# Patient Record
Sex: Female | Born: 1965 | Race: White | Hispanic: No | Marital: Married | Smoking: Never smoker
Health system: Southern US, Community
[De-identification: ages and names within clinical notes are randomized; demographics above are authoritative.]

## PROBLEM LIST (undated history)

## (undated) DIAGNOSIS — M549 Dorsalgia, unspecified: Secondary | ICD-10-CM

## (undated) DIAGNOSIS — Z86718 Personal history of other venous thrombosis and embolism: Secondary | ICD-10-CM

## (undated) DIAGNOSIS — F329 Major depressive disorder, single episode, unspecified: Secondary | ICD-10-CM

## (undated) DIAGNOSIS — E785 Hyperlipidemia, unspecified: Secondary | ICD-10-CM

## (undated) DIAGNOSIS — I10 Essential (primary) hypertension: Secondary | ICD-10-CM

## (undated) DIAGNOSIS — G8929 Other chronic pain: Secondary | ICD-10-CM

## (undated) DIAGNOSIS — R51 Headache: Secondary | ICD-10-CM

## (undated) DIAGNOSIS — F32A Depression, unspecified: Secondary | ICD-10-CM

## (undated) DIAGNOSIS — R519 Headache, unspecified: Secondary | ICD-10-CM

## (undated) DIAGNOSIS — K529 Noninfective gastroenteritis and colitis, unspecified: Secondary | ICD-10-CM

## (undated) HISTORY — DX: Headache, unspecified: R51.9

## (undated) HISTORY — DX: Other chronic pain: G89.29

## (undated) HISTORY — DX: Noninfective gastroenteritis and colitis, unspecified: K52.9

## (undated) HISTORY — DX: Hyperlipidemia, unspecified: E78.5

## (undated) HISTORY — DX: Dorsalgia, unspecified: M54.9

## (undated) HISTORY — PX: PELVIC LAPAROSCOPY: SHX162

## (undated) HISTORY — DX: Headache: R51

## (undated) HISTORY — DX: Depression, unspecified: F32.A

## (undated) HISTORY — DX: Major depressive disorder, single episode, unspecified: F32.9

## (undated) HISTORY — DX: Personal history of other venous thrombosis and embolism: Z86.718

## (undated) HISTORY — DX: Essential (primary) hypertension: I10

---

## 1988-02-23 HISTORY — PX: ABDOMINAL HYSTERECTOMY: SHX81

## 1998-02-22 DIAGNOSIS — Z86718 Personal history of other venous thrombosis and embolism: Secondary | ICD-10-CM

## 1998-02-22 HISTORY — DX: Personal history of other venous thrombosis and embolism: Z86.718

## 1998-05-30 ENCOUNTER — Encounter: Payer: Self-pay | Admitting: Chiropractic Medicine

## 1998-05-30 ENCOUNTER — Ambulatory Visit (HOSPITAL_COMMUNITY): Admission: RE | Admit: 1998-05-30 | Discharge: 1998-05-30 | Payer: Self-pay | Admitting: Chiropractic Medicine

## 1998-07-29 ENCOUNTER — Encounter: Payer: Self-pay | Admitting: Neurosurgery

## 1998-07-29 ENCOUNTER — Ambulatory Visit (HOSPITAL_COMMUNITY): Admission: RE | Admit: 1998-07-29 | Discharge: 1998-07-29 | Payer: Self-pay | Admitting: Neurosurgery

## 1999-12-05 ENCOUNTER — Emergency Department (HOSPITAL_COMMUNITY): Admission: EM | Admit: 1999-12-05 | Discharge: 1999-12-05 | Payer: Self-pay | Admitting: Emergency Medicine

## 2000-10-28 ENCOUNTER — Other Ambulatory Visit: Admission: RE | Admit: 2000-10-28 | Discharge: 2000-10-28 | Payer: Self-pay | Admitting: Obstetrics & Gynecology

## 2001-06-15 ENCOUNTER — Inpatient Hospital Stay (HOSPITAL_COMMUNITY): Admission: EM | Admit: 2001-06-15 | Discharge: 2001-06-16 | Payer: Self-pay | Admitting: Emergency Medicine

## 2001-06-28 ENCOUNTER — Encounter: Admission: RE | Admit: 2001-06-28 | Discharge: 2001-09-26 | Payer: Self-pay | Admitting: *Deleted

## 2002-11-13 LAB — HM COLONOSCOPY

## 2003-01-07 ENCOUNTER — Ambulatory Visit (HOSPITAL_COMMUNITY): Admission: RE | Admit: 2003-01-07 | Discharge: 2003-01-07 | Payer: Self-pay | Admitting: Gastroenterology

## 2003-01-12 ENCOUNTER — Inpatient Hospital Stay (HOSPITAL_COMMUNITY): Admission: EM | Admit: 2003-01-12 | Discharge: 2003-01-13 | Payer: Self-pay | Admitting: Emergency Medicine

## 2003-03-27 ENCOUNTER — Other Ambulatory Visit: Admission: RE | Admit: 2003-03-27 | Discharge: 2003-03-27 | Payer: Self-pay | Admitting: Obstetrics & Gynecology

## 2003-04-29 ENCOUNTER — Encounter: Admission: RE | Admit: 2003-04-29 | Discharge: 2003-04-29 | Payer: Self-pay | Admitting: Obstetrics & Gynecology

## 2003-08-23 ENCOUNTER — Emergency Department (HOSPITAL_COMMUNITY): Admission: EM | Admit: 2003-08-23 | Discharge: 2003-08-23 | Payer: Self-pay | Admitting: Emergency Medicine

## 2004-07-20 ENCOUNTER — Emergency Department (HOSPITAL_COMMUNITY): Admission: EM | Admit: 2004-07-20 | Discharge: 2004-07-20 | Payer: Self-pay | Admitting: *Deleted

## 2005-06-14 ENCOUNTER — Inpatient Hospital Stay (HOSPITAL_COMMUNITY): Admission: EM | Admit: 2005-06-14 | Discharge: 2005-06-16 | Payer: Self-pay | Admitting: Family Medicine

## 2005-08-04 ENCOUNTER — Encounter: Admission: RE | Admit: 2005-08-04 | Discharge: 2005-08-04 | Payer: Self-pay | Admitting: Obstetrics & Gynecology

## 2005-08-04 LAB — HM MAMMOGRAPHY: HM Mammogram: NORMAL

## 2005-08-19 ENCOUNTER — Encounter: Admission: RE | Admit: 2005-08-19 | Discharge: 2005-08-19 | Payer: Self-pay | Admitting: Obstetrics & Gynecology

## 2006-02-22 HISTORY — PX: OOPHORECTOMY: SHX86

## 2006-08-02 ENCOUNTER — Ambulatory Visit (HOSPITAL_COMMUNITY): Admission: RE | Admit: 2006-08-02 | Discharge: 2006-08-03 | Payer: Self-pay | Admitting: Obstetrics & Gynecology

## 2006-08-02 ENCOUNTER — Encounter (INDEPENDENT_AMBULATORY_CARE_PROVIDER_SITE_OTHER): Payer: Self-pay | Admitting: Obstetrics & Gynecology

## 2006-09-02 LAB — CONVERTED CEMR LAB: Pap Smear: NORMAL

## 2006-09-15 ENCOUNTER — Emergency Department (HOSPITAL_COMMUNITY): Admission: EM | Admit: 2006-09-15 | Discharge: 2006-09-15 | Payer: Self-pay | Admitting: Emergency Medicine

## 2008-03-25 HISTORY — PX: SPINE SURGERY: SHX786

## 2008-03-26 ENCOUNTER — Inpatient Hospital Stay (HOSPITAL_COMMUNITY): Admission: RE | Admit: 2008-03-26 | Discharge: 2008-03-28 | Payer: Self-pay | Admitting: Orthopedic Surgery

## 2008-03-26 ENCOUNTER — Encounter (INDEPENDENT_AMBULATORY_CARE_PROVIDER_SITE_OTHER): Payer: Self-pay | Admitting: Orthopedic Surgery

## 2008-07-30 LAB — HM PAP SMEAR: HM Pap smear: NORMAL

## 2008-10-09 ENCOUNTER — Emergency Department (HOSPITAL_BASED_OUTPATIENT_CLINIC_OR_DEPARTMENT_OTHER): Admission: EM | Admit: 2008-10-09 | Discharge: 2008-10-09 | Payer: Self-pay | Admitting: Emergency Medicine

## 2008-10-09 ENCOUNTER — Ambulatory Visit: Payer: Self-pay | Admitting: Diagnostic Radiology

## 2008-10-10 ENCOUNTER — Ambulatory Visit (HOSPITAL_COMMUNITY): Admission: RE | Admit: 2008-10-10 | Discharge: 2008-10-10 | Payer: Self-pay | Admitting: Emergency Medicine

## 2008-10-29 ENCOUNTER — Ambulatory Visit: Payer: Self-pay | Admitting: Internal Medicine

## 2008-10-29 DIAGNOSIS — R51 Headache: Secondary | ICD-10-CM | POA: Insufficient documentation

## 2008-10-29 DIAGNOSIS — F329 Major depressive disorder, single episode, unspecified: Secondary | ICD-10-CM | POA: Insufficient documentation

## 2008-10-29 DIAGNOSIS — I10 Essential (primary) hypertension: Secondary | ICD-10-CM | POA: Insufficient documentation

## 2008-10-29 DIAGNOSIS — E1169 Type 2 diabetes mellitus with other specified complication: Secondary | ICD-10-CM | POA: Insufficient documentation

## 2008-10-29 DIAGNOSIS — R519 Headache, unspecified: Secondary | ICD-10-CM | POA: Insufficient documentation

## 2008-10-29 DIAGNOSIS — E785 Hyperlipidemia, unspecified: Secondary | ICD-10-CM

## 2008-11-19 ENCOUNTER — Ambulatory Visit: Payer: Self-pay | Admitting: Internal Medicine

## 2008-11-19 LAB — HM DIABETES FOOT EXAM

## 2008-12-09 LAB — HM DIABETES EYE EXAM: HM Diabetic Eye Exam: NORMAL

## 2009-02-13 ENCOUNTER — Ambulatory Visit: Payer: Self-pay | Admitting: Internal Medicine

## 2009-02-13 LAB — CONVERTED CEMR LAB
ALT: 29 units/L (ref 0–35)
AST: 19 units/L (ref 0–37)
Albumin: 4.6 g/dL (ref 3.5–5.2)
Alkaline Phosphatase: 84 units/L (ref 39–117)
BUN: 18 mg/dL (ref 6–23)
Bilirubin Urine: NEGATIVE
Bilirubin, Direct: 0.1 mg/dL (ref 0.0–0.3)
CO2: 26 meq/L (ref 19–32)
Calcium: 9.7 mg/dL (ref 8.4–10.5)
Casts: NONE SEEN /lpf
Chloride: 100 meq/L (ref 96–112)
Cholesterol: 247 mg/dL — ABNORMAL HIGH (ref 0–200)
Creatinine, Ser: 0.74 mg/dL (ref 0.40–1.20)
Crystals: NONE SEEN
Glucose, Bld: 187 mg/dL — ABNORMAL HIGH (ref 70–99)
HDL: 40 mg/dL (ref >39)
Hemoglobin, Urine: NEGATIVE
Hgb A1c MFr Bld: 10.4 % — ABNORMAL HIGH (ref 4.6–6.1)
Indirect Bilirubin: 0.4 mg/dL (ref 0.0–0.9)
LDL Cholesterol: 172 mg/dL — ABNORMAL HIGH (ref 0–99)
Leukocytes, UA: NEGATIVE
Nitrite: NEGATIVE
Potassium: 4.2 meq/L (ref 3.5–5.3)
Protein, ur: NEGATIVE mg/dL
RBC / HPF: NONE SEEN (ref <3)
Sodium: 136 meq/L (ref 135–145)
Specific Gravity, Urine: 1.028 (ref 1.005–1.030)
TSH: 4.246 microintl units/mL (ref 0.350–4.500)
Total Bilirubin: 0.5 mg/dL (ref 0.3–1.2)
Total CHOL/HDL Ratio: 6.2
Total Protein: 7.4 g/dL (ref 6.0–8.3)
Triglycerides: 175 mg/dL — ABNORMAL HIGH (ref <150)
Urine Glucose: NEGATIVE mg/dL
Urobilinogen, UA: 0.2 (ref 0.0–1.0)
VLDL: 35 mg/dL (ref 0–40)
WBC, UA: NONE SEEN cells/hpf (ref <3)
pH: 5.5 (ref 5.0–8.0)

## 2009-02-20 ENCOUNTER — Ambulatory Visit: Payer: Self-pay | Admitting: Internal Medicine

## 2009-02-20 DIAGNOSIS — E669 Obesity, unspecified: Secondary | ICD-10-CM | POA: Insufficient documentation

## 2009-02-20 DIAGNOSIS — R002 Palpitations: Secondary | ICD-10-CM | POA: Insufficient documentation

## 2009-02-20 DIAGNOSIS — R079 Chest pain, unspecified: Secondary | ICD-10-CM | POA: Insufficient documentation

## 2009-02-20 DIAGNOSIS — E1169 Type 2 diabetes mellitus with other specified complication: Secondary | ICD-10-CM | POA: Insufficient documentation

## 2009-02-20 LAB — CONVERTED CEMR LAB: Microalb, Ur: 1.23 mg/dL (ref 0.00–1.89)

## 2009-02-24 ENCOUNTER — Encounter (INDEPENDENT_AMBULATORY_CARE_PROVIDER_SITE_OTHER): Payer: Self-pay | Admitting: *Deleted

## 2009-02-26 ENCOUNTER — Telehealth: Payer: Self-pay | Admitting: Internal Medicine

## 2009-03-07 DIAGNOSIS — R7309 Other abnormal glucose: Secondary | ICD-10-CM | POA: Insufficient documentation

## 2009-03-12 ENCOUNTER — Encounter: Payer: Self-pay | Admitting: Cardiology

## 2009-03-12 ENCOUNTER — Ambulatory Visit: Payer: Self-pay | Admitting: Cardiology

## 2009-03-20 ENCOUNTER — Ambulatory Visit: Payer: Self-pay | Admitting: Internal Medicine

## 2009-03-31 ENCOUNTER — Ambulatory Visit: Payer: Self-pay

## 2009-03-31 ENCOUNTER — Ambulatory Visit: Payer: Self-pay | Admitting: Cardiology

## 2009-03-31 ENCOUNTER — Encounter: Payer: Self-pay | Admitting: Cardiology

## 2009-03-31 ENCOUNTER — Ambulatory Visit (HOSPITAL_COMMUNITY): Admission: RE | Admit: 2009-03-31 | Discharge: 2009-03-31 | Payer: Self-pay | Admitting: Cardiology

## 2009-05-05 ENCOUNTER — Telehealth: Payer: Self-pay | Admitting: Internal Medicine

## 2009-05-26 ENCOUNTER — Encounter: Payer: Self-pay | Admitting: Internal Medicine

## 2009-05-26 LAB — CONVERTED CEMR LAB
ALT: 34 units/L (ref 0–35)
AST: 21 units/L (ref 0–37)
BUN: 17 mg/dL (ref 6–23)
CO2: 28 meq/L (ref 19–32)
Calcium: 9.6 mg/dL (ref 8.4–10.5)
Chloride: 101 meq/L (ref 96–112)
Cholesterol: 217 mg/dL — ABNORMAL HIGH (ref 0–200)
Creatinine, Ser: 0.71 mg/dL (ref 0.40–1.20)
Glucose, Bld: 189 mg/dL — ABNORMAL HIGH (ref 70–99)
HDL: 36 mg/dL — ABNORMAL LOW (ref >39)
Hgb A1c MFr Bld: 11.2 % — ABNORMAL HIGH (ref 4.6–6.1)
LDL Cholesterol: 150 mg/dL — ABNORMAL HIGH (ref 0–99)
Potassium: 4.3 meq/L (ref 3.5–5.3)
Sodium: 138 meq/L (ref 135–145)
Total CHOL/HDL Ratio: 6
Triglycerides: 157 mg/dL — ABNORMAL HIGH (ref <150)
VLDL: 31 mg/dL (ref 0–40)

## 2009-06-02 ENCOUNTER — Ambulatory Visit: Payer: Self-pay | Admitting: Internal Medicine

## 2009-06-02 DIAGNOSIS — M545 Low back pain, unspecified: Secondary | ICD-10-CM | POA: Insufficient documentation

## 2009-06-04 ENCOUNTER — Encounter (INDEPENDENT_AMBULATORY_CARE_PROVIDER_SITE_OTHER): Payer: Self-pay | Admitting: *Deleted

## 2009-06-19 ENCOUNTER — Encounter: Payer: Self-pay | Admitting: Internal Medicine

## 2009-07-02 ENCOUNTER — Telehealth: Payer: Self-pay | Admitting: Internal Medicine

## 2010-02-23 ENCOUNTER — Emergency Department (HOSPITAL_BASED_OUTPATIENT_CLINIC_OR_DEPARTMENT_OTHER)
Admission: EM | Admit: 2010-02-23 | Discharge: 2010-02-24 | Disposition: A | Discharge status: Other Acute Inpatient Hospital | Payer: Self-pay | Source: Home / Self Care | Admitting: Emergency Medicine

## 2010-02-24 ENCOUNTER — Inpatient Hospital Stay (HOSPITAL_COMMUNITY)
Admission: EM | Admit: 2010-02-24 | Discharge: 2010-02-24 | Payer: Self-pay | Source: Home / Self Care | Attending: Otolaryngology | Admitting: Otolaryngology

## 2010-03-24 NOTE — Assessment & Plan Note (Signed)
Summary: [facility]   Visit Type:  Initial Consult Primary Provider:  Dondra Spry DO  CC:  chest pain.  History of Present Illness: 45 yo female for evaluation of chest pain. Pt had a Myoview in April of 2007 that showed normal LV function and normal perfusion. She has had intermittent chest pain for years. The pain is in the left chest area. She states it increases with stress. He can also occur with exertion but it also occurs in the mornings with rest. He described as a pressure. There is a tingling in the left upper extremity. There is no associated nausea, vomiting, shortness of breath or diaphoresis. The pain is not pleuritic or positional nor is it related to food. Because of her chest pain we were asked to further evaluate. Note she did not have dyspnea on exertion, orthopnea, PND or syncope. Also note she does state that she occasionally feels severe pain in her head when she has her chest pain.  Preventive Screening-Counseling & Management  Alcohol-Tobacco     Smoking Status: never  Current Medications (verified): 1)  Lantus Solostar 100 Unit/ml Soln (Insulin Glargine) .... 60-80 Units Once Daily 2)  Novolog Flexpen 100 Unit/ml Soln (Insulin Aspart) .Marland KitchenMarland KitchenMarland Kitchen 15-30 Units 15-30 Mins Before Meals Tid 3)  Lisinopril 20 Mg Tabs (Lisinopril) .... One By Mouth Qd 4)  Multivitamins  Tabs (Multiple Vitamin) .... Take 1 Tablet By Mouth Once A Day 5)  Tramadol Hcl 50 Mg Tabs (Tramadol Hcl) .... Take 1 Tablet By Mouth Two Times A Day As Needed 6)  Vicodin 5-500 Mg Tabs (Hydrocodone-Acetaminophen) .... Take 1 Tablet By Mouth Once A Day As Needed Back Pain 7)  Metformin Hcl 500 Mg Tabs (Metformin Hcl) .... One By Mouth Bid 8)  Aspirin 81 Mg Tbec (Aspirin) .... One By Mouth Qd 9)  Bd Pen Needle Ultrafine 29g X 12.77mm Misc (Insulin Pen Needle) .... Use For Insulin Injection 4 Times A Day As Directed 10)  Crestor 20 Mg Tabs (Rosuvastatin Calcium) .... One By Mouth Once Daily 11)  Amlodipine  Besylate 2.5 Mg Tabs (Amlodipine Besylate) .... One By Mouth Once Daily  Allergies (verified): No Known Drug Allergies  Past History:  Past Medical History: Current Problems:  HYPERLIPIDEMIA (ICD-272.4) HYPERTENSION (ICD-401.9) DIABETES MELLITUS, TYPE II, UNCONTROLLED (ICD-250.02) DEPRESSION (ICD-311) inflammatory bowel syndrome  Headache- daily  History of Blood Clot 2000  Hx of chronic low back pain  Past Surgical History: Back Surgey Feb 2010 - Dr. Alveda Reasons  oophorectomy 2008 1990- Hysterecotomy   H/O laporoscopy for endometriosis  Family History: Reviewed history from 02/20/2009 and no changes required. Family History of Alcoholism/Addiction Family History of Arthritis Family History Breast cancer 1st degree relative <50 Family History of CAD Female 1st degree relative <60 Family History of Colon CA 1st degree relative <60 Family History Diabetes 1st degree relative Family History High cholesterol Family History Hypertension Family History Kidney disease Family History Ovarian cancer Family History of Prostate CA 1st degree relative <50 Family History Psychiatric care Family History of Stroke F 1st degree relative <60 Family History of Stroke M 1st degree relative <50   Mother with MI at 36     Social History: Reviewed history from 02/20/2009 and no changes required. Occupation: Paediatric nurse Married 9 years  2 daughters 55, 55  Tobacco Use - No.  Alcohol Use - yes  Review of Systems       Problems with headaches, and constipation but no fevers or chills, productive cough, hemoptysis, dysphasia,  odynophagia, melena, hematochezia, dysuria, hematuria, rash, seizure activity, orthopnea, PND, pedal edema, claudication. Remaining systems are negative.   Vital Signs:  Patient profile:   45 year old female Height:      67 inches Weight:      216.50 pounds BMI:     34.03 Pulse rate:   82 / minute Pulse rhythm:   regular Resp:     18 per minute BP sitting:    135 / 92  (right arm) Cuff size:   large  Vitals Entered By: Vikki Ports (March 12, 2009 2:55 PM)  Physical Exam  General:  Well developed/well nourished in NAD Skin warm/dry Patient not depressed No peripheral clubbing Back-normal HEENT-normal/normal eyelids Neck supple/normal carotid upstroke bilaterally; no bruits; no JVD; no thyromegaly chest - CTA/ normal expansion CV - RRR/normal S1 and S2; no murmurs, rubs or gallops;  PMI nondisplaced Abdomen -NT/ND, no HSM, no mass, + bowel sounds, no bruit 2+ femoral pulses, no bruits Ext-no edema, chords, 2+ DP Neuro-grossly nonfocal     EKG  Procedure date:  03/12/2009  Findings:      Sinus rhythm at a rate of 90. Axis normal. No ST changes.  Impression & Recommendations:  Problem # 1:  CHEST PAIN (ICD-786.50) Symptoms atypical multiple risk factors. Will risk stratify her stress echocardiogram. Her updated medication list for this problem includes:    Lisinopril 20 Mg Tabs (Lisinopril) ..... One by mouth qd    Aspirin 81 Mg Tbec (Aspirin) ..... One by mouth qd    Amlodipine Besylate 2.5 Mg Tabs (Amlodipine besylate) ..... One by mouth once daily  Orders: Stress Echo (Stress Echo)  Problem # 2:  HYPERLIPIDEMIA (ICD-272.4) Continue statin. Lipids and liver monitored by primary care. Her updated medication list for this problem includes:    Crestor 20 Mg Tabs (Rosuvastatin calcium) ..... One by mouth once daily  Problem # 3:  HYPERTENSION (ICD-401.9) Blood pressure mildly elevated. She will follow this and her medications can be increased as needed by her primary care physician. Her updated medication list for this problem includes:    Lisinopril 20 Mg Tabs (Lisinopril) ..... One by mouth qd    Aspirin 81 Mg Tbec (Aspirin) ..... One by mouth qd    Amlodipine Besylate 2.5 Mg Tabs (Amlodipine besylate) ..... One by mouth once daily  Problem # 4:  DIABETES MELLITUS, TYPE II, UNCONTROLLED (ICD-250.02)  Her updated  medication list for this problem includes:    Lantus Solostar 100 Unit/ml Soln (Insulin glargine) .Marland KitchenMarland KitchenMarland KitchenMarland Kitchen 60-80 units once daily    Novolog Flexpen 100 Unit/ml Soln (Insulin aspart) .Marland KitchenMarland KitchenMarland KitchenMarland Kitchen 15-30 units 15-30 mins before meals tid    Lisinopril 20 Mg Tabs (Lisinopril) ..... One by mouth qd    Metformin Hcl 500 Mg Tabs (Metformin hcl) ..... One by mouth bid    Aspirin 81 Mg Tbec (Aspirin) ..... One by mouth qd  Problem # 5:  DEPRESSION (ICD-311)  Problem # 6:  HEADACHE (ICD-784.0)  Her updated medication list for this problem includes:    Tramadol Hcl 50 Mg Tabs (Tramadol hcl) .Marland Kitchen... Take 1 tablet by mouth two times a day as needed    Vicodin 5-500 Mg Tabs (Hydrocodone-acetaminophen) .Marland Kitchen... Take 1 tablet by mouth once a day as needed back pain    Aspirin 81 Mg Tbec (Aspirin) ..... One by mouth qd  Patient Instructions: 1)  Your physician recommends that you schedule a follow-up appointment in: AS NEEDED PENDING TEST RESULTS 2)  Your physician has requested that  you have a stress echocardiogram. For further information please visit [email or web address]  Please follow instruction sheet as given.

## 2010-03-24 NOTE — Assessment & Plan Note (Signed)
Summary: 3 MONTH FOLLOW UP/DK   Vital Signs:  Patient profile:   45 year old female Weight:      219.25 pounds Temp:     98.1 degrees F oral Pulse rate:   72 / minute BP sitting:   140 / 80  (left arm) Cuff size:   regular  Vitals Entered By: Romualdo Bolk, CMA (AAMA) (February 20, 2009 2:27 PM) CC: 3 month follow up, Type 2 diabetes mellitus follow-up   Primary Care Provider:  DThomos Lemons DO  CC:  3 month follow up and Type 2 diabetes mellitus follow-up.  History of Present Illness:  Type 2 Diabetes Mellitus Follow-Up      This is a 45 year old woman who presents for Type 2 diabetes mellitus follow-up.  The patient denies polyuria, polydipsia, self managed hypoglycemia, and hypoglycemia requiring help.  Other symptoms include chest pain.  Since the last visit the patient reports compliance with medications, not exercising regularly, and monitoring blood glucose.  her morning blood sugar was 252 this morning.  Before she started metformin and her current insulin regimen her blood sugar was between 500 and 600.       The patient also complains of experiencing Chest pain.  The patient reports resting chest pain and palpitations, but denies shortness of breath.  The pain is located in the substernal area.  The pain radiates to the left arm.  Episodes of chest pain last 1-2 minutes.  chest pain comes and goes.   She is not currently experiencing any chest pain.  Preventive Screening-Counseling & Management  Alcohol-Tobacco     Alcohol drinks/day: 0     Alcohol Counseling: not indicated; patient does not drink     Smoking Status: never     Tobacco Counseling: not indicated; no tobacco use  Caffeine-Diet-Exercise     Caffeine use/day: 2 liters daily up to 2 weeks ago     Caffeine Counseling: STOP use of caffeine     Does Patient Exercise: yes     Type of exercise: walking     Times/week: 5  Current Medications (verified): 1)  Lantus Solostar 100 Unit/ml Soln (Insulin  Glargine) .... 60-80 Units Once Daily 2)  Novolog Flexpen 100 Unit/ml Soln (Insulin Aspart) .Marland KitchenMarland KitchenMarland Kitchen 15-30 Units 15-30 Mins Before Meals Tid 3)  Lisinopril 20 Mg Tabs (Lisinopril) .... One By Mouth Qd 4)  Multivitamins  Tabs (Multiple Vitamin) .... Take 1 Tablet By Mouth Once A Day 5)  Insulin Syringe/needle 28g X 1/2" 1 Ml Misc (Insulin Syringe-Needle U-100) .... Use Daily As Directed 6)  Tramadol Hcl 50 Mg Tabs (Tramadol Hcl) .... Take 1 Tablet By Mouth Two Times A Day As Needed 7)  Vicodin 5-500 Mg Tabs (Hydrocodone-Acetaminophen) .... Take 1 Tablet By Mouth Once A Day As Needed Back Pain 8)  Metformin Hcl 500 Mg Tabs (Metformin Hcl) .... One By Mouth Bid 9)  Aspirin 81 Mg Tbec (Aspirin) .... One By Mouth Qd 10)  Bd Pen Needle Ultrafine 29g X 12.83mm Misc (Insulin Pen Needle) .... Use 4 X Per Day As Directed  Allergies (verified): No Known Drug Allergies  Past History:  Past Medical History: Depression-counseling 2005  inflammatory bowel syndrome  Diabetes mellitus, type II Headache- daily  Hypertension Hyperlipidemia History of Blood Clot 2000  Hx of chronic low back pain  Past Surgical History: Back Surgey Feb 2010 - Dr. Alveda Reasons  oophorectomy 2008 1990- Hysterecotomy    Family History: Family History of Alcoholism/Addiction Family History of  Arthritis Family History Breast cancer 1st degree relative <50 Family History of CAD Female 1st degree relative <60 Family History of Colon CA 1st degree relative <60 Family History Diabetes 1st degree relative Family History High cholesterol Family History Hypertension Family History Kidney disease Family History Ovarian cancer Family History of Prostate CA 1st degree relative <50 Family History Psychiatric care Family History of Stroke F 1st degree relative <60 Family History of Stroke M 1st degree relative <50       Social History: Occupation: Paediatric nurse Married 9 years  2 daughters 21, 24      Review of Systems        The patient complains of chest pain.  The patient denies abdominal pain.    Physical Exam  General:  alert, well-developed, and well-nourished.   Neck:  supple and no masses.  no tenderness. No carotid bruit Lungs:  normal respiratory effort and normal breath sounds.   Heart:  normal rate, regular rhythm, no murmur, and no gallop.   Extremities:  No lower extremity edema  Neurologic:  cranial nerves II-XII intact and gait normal.   Psych:  normally interactive.     Impression & Recommendations:  Problem # 1:  CHEST PAIN (ICD-786.50) Assessment New Pt with mostly non exertional chest pain.   No assoc SOB.   EKG shows NSR at 92 bpm.  continue aspirin.  crestor added.  Patient advised to call office if symptoms persist or worsen.  she has multiple risk factors.  I advise further cardiac testing.   refer to Dr. Jens Som  Orders: Cardiology Referral (Cardiology)  Problem # 2:  HYPERLIPIDEMIA (ICD-272.4) goal LDL < 70.  strong fam hx of CAD.  she could not tolerate lipitor due to myalgias in the past.  Her updated medication list for this problem includes:    Crestor 20 Mg Tabs (Rosuvastatin calcium) ..... One by mouth once daily  Labs Reviewed: SGOT: 19 (02/13/2009)   SGPT: 29 (02/13/2009)   HDL:40 (02/13/2009)  LDL:172 (02/13/2009)  Chol:247 (02/13/2009)  Trig:175 (02/13/2009)  Problem # 3:  DIABETES MELLITUS, TYPE II, UNCONTROLLED (ICD-250.02) Assessment: Improved CBG better.  prev avg sugars 400-500.  pt experiencing some heartburn with metformin.   before we further adjust insulin, I suggest referral to nutritional couseling to optimize dietary measures.  Her updated medication list for this problem includes:    Lantus Solostar 100 Unit/ml Soln (Insulin glargine) .Marland KitchenMarland KitchenMarland KitchenMarland Kitchen 60-80 units once daily    Novolog Flexpen 100 Unit/ml Soln (Insulin aspart) .Marland KitchenMarland KitchenMarland KitchenMarland Kitchen 15-30 units 15-30 mins before meals tid    Lisinopril 20 Mg Tabs (Lisinopril) ..... One by mouth qd    Metformin Hcl 500 Mg  Tabs (Metformin hcl) ..... One by mouth bid    Aspirin 81 Mg Tbec (Aspirin) ..... One by mouth qd  Orders: Nutrition Referral (Nutrition)  Problem # 4:  PALPITATIONS (ICD-785.1) Pt reports heart racing at night.  blood sugar reported normal.   recent TSH normal.  EKG   Problem # 5:  HYPERTENSION (ICD-401.9) No significant change.  add amlodipine.  Her updated medication list for this problem includes:    Lisinopril 20 Mg Tabs (Lisinopril) ..... One by mouth qd    Amlodipine Besylate 2.5 Mg Tabs (Amlodipine besylate) ..... One by mouth once daily  BP today: 140/80 Prior BP: 140/80 (11/19/2008)  Labs Reviewed: K+: 4.2 (02/13/2009) Creat: : 0.74 (02/13/2009)   Chol: 247 (02/13/2009)   HDL: 40 (02/13/2009)   LDL: 172 (02/13/2009)   TG: 175 (02/13/2009)  Complete Medication List: 1)  Lantus Solostar 100 Unit/ml Soln (Insulin glargine) .... 60-80 units once daily 2)  Novolog Flexpen 100 Unit/ml Soln (Insulin aspart) .Marland KitchenMarland KitchenMarland Kitchen 15-30 units 15-30 mins before meals tid 3)  Lisinopril 20 Mg Tabs (Lisinopril) .... One by mouth qd 4)  Multivitamins Tabs (Multiple vitamin) .... Take 1 tablet by mouth once a day 5)  Insulin Syringe/needle 28g X 1/2" 1 Ml Misc (Insulin syringe-needle u-100) .... Use daily as directed 6)  Tramadol Hcl 50 Mg Tabs (Tramadol hcl) .... Take 1 tablet by mouth two times a day as needed 7)  Vicodin 5-500 Mg Tabs (Hydrocodone-acetaminophen) .... Take 1 tablet by mouth once a day as needed back pain 8)  Metformin Hcl 500 Mg Tabs (Metformin hcl) .... One by mouth bid 9)  Aspirin 81 Mg Tbec (Aspirin) .... One by mouth qd 10)  Bd Pen Needle Ultrafine 29g X 12.66mm Misc (Insulin pen needle) .... Use 4 x per day as directed 11)  Crestor 20 Mg Tabs (Rosuvastatin calcium) .... One by mouth once daily 12)  Amlodipine Besylate 2.5 Mg Tabs (Amlodipine besylate) .... One by mouth once daily  Patient Instructions: 1)  Please schedule a follow-up appointment in 1  month. Prescriptions: AMLODIPINE BESYLATE 2.5 MG TABS (AMLODIPINE BESYLATE) one by mouth once daily  #30 x 2   Entered and Authorized by:   D. Thomos Lemons DO   Signed by:   D. Thomos Lemons DO on 02/20/2009   Method used:   Electronically to        [id number] (retail)       [address] [place], [place]  [address]       Ph: [phone] or [phone]       Fax: [phone]   RxID:   [id number] METFORMIN HCL 500 MG TABS (METFORMIN HCL) one by mouth bid  #60 x 2   Entered and Authorized by:   D. Thomos Lemons DO   Signed by:   D. Thomos Lemons DO on 02/20/2009   Method used:   Electronically to        [id number] (retail)       [address] [place], [place]  [address]       Ph: [phone] or [phone]       Fax: [phone]   RxID:   [id number] INSULIN SYRINGE/NEEDLE 28G X 1/2" 1 ML MISC (INSULIN SYRINGE-NEEDLE U-100) use daily as directed  #100 x 3   Entered and Authorized by:   D. Thomos Lemons DO   Signed by:   D. Thomos Lemons DO on 02/20/2009   Method used:   Electronically to        [id number] ([facility]       [address] [place], [place]  [address]       Ph: [phone] or [phone]       Fax: [phone]   RxID:   [id number] LISINOPRIL 20 MG TABS (LISINOPRIL) one by mouth qd  #30 x 2   Entered and Authorized by:   D. Thomos Lemons DO   Signed by:   D. Thomos Lemons DO on 02/20/2009   Method used:   Electronically to  [phone][facility] (retail)       [address], [place]  [address]       Ph: [phone] or [phone]       Fax: [phone]   RxID:   [id number] NOVOLOG FLEXPEN 100 UNIT/ML SOLN (INSULIN ASPART) 15-30 units 15-30 mins before meals tid  #1 month x 3   Entered and Authorized by:   D. Thomos Lemons DO   Signed by:   D. Thomos Lemons DO on 02/20/2009   Method used:   Electronically to        [id number] ([facility])       [address] [place], [place]  [address]       Ph: [phone] or [phone]       Fax: [phone]   RxID:   [id number] LANTUS SOLOSTAR 100 UNIT/ML SOLN (INSULIN GLARGINE) 60-80 units once daily  #1 month x 2   Entered and Authorized by:   D. Thomos Lemons DO   Signed by:   D. Thomos Lemons DO on 02/20/2009   Method used:   Electronically to        [id number] ([facility]       [address] [place], [place]  [address]       Ph: [phone] or [phone]       Fax: [phone]   RxID:   [id number] CRESTOR 20 MG TABS (ROSUVASTATIN CALCIUM) one by mouth once daily  #30 x 2   Entered and Authorized by:   D. Thomos Lemons DO   Signed by:   D. Thomos Lemons DO on 02/20/2009   Method used:   Electronically to        [id number] ([facility]       [address]       [place], [place]  [address]       Ph: [phone] or [phone]       Fax: [phone]   RxID:   [id number]

## 2010-03-24 NOTE — Consult Note (Signed)
Summary: [facility]   Imported By: Lanelle Bal 08/13/2009 11:01:47  _____________________________________________________________________  External Attachment:    Type:   Image     Comment:   External Document

## 2010-03-24 NOTE — Assessment & Plan Note (Signed)
Summary: 2 MONTH FOLLOW UP/MHF   Vital Signs:  Patient profile:   45 year old female Height:      67 inches Weight:      209 pounds BMI:     32.85 O2 Sat:      97 % on Room air Temp:     98.1 degrees F oral Pulse rate:   86 / minute Pulse rhythm:   regular Resp:     18 per minute BP sitting:   116 / 80  (left arm) Cuff size:   large  Vitals Entered By: Glendell Docker CMA (June 02, 2009 3:48 PM)  O2 Flow:  Room air CC: Rm 3- 2 Month Follow up disease management    Primary Care Provider:  DThomos Lemons DO  CC:  Rm 3- 2 Month Follow up disease management .  History of Present Illness:  45 y/o white female with uncontrolled DM II for follow up she was doing fairly well after prev visit with diet and exercise but she exp severe flare of left sided back pain she is considering back surgery but would like to consider alternatives  she was seen by Dr. Alveda Reasons who performed steroid injections.  her blood sugars have been much higher since.  low blood sugar 98 high 500 avg 200's  random elevation  hyperlipidemia - when she took 1/4 of crestor tab regularly she developed aching sensation in her thighs.  similar occurence with lipitor and vytorin  Allergies (verified): No Known Drug Allergies  Past History:  Past Medical History: Current Problems:  HYPERLIPIDEMIA (ICD-272.4) HYPERTENSION (ICD-401.9) DIABETES MELLITUS, TYPE II, UNCONTROLLED (ICD-250.02) DEPRESSION (ICD-311) inflammatory bowel syndrome  Headache- daily   History of Blood Clot 2000  Hx of chronic low back pain  Family History: Family History of Alcoholism/Addiction Family History of Arthritis Family History Breast cancer 1st degree relative <50 Family History of CAD Female 1st degree relative <60 Family History of Colon CA 1st degree relative <60 Family History Diabetes 1st degree relative Family History High cholesterol Family History Hypertension Family History Kidney disease Family History Ovarian  cancer Family History of Prostate CA 1st degree relative <50 Family History Psychiatric care Family History of Stroke F 1st degree relative <60 Family History of Stroke M 1st degree relative <50   Mother with MI at 73        Social History: Occupation: Paediatric nurse Married 9 years  2 daughters 59, 16  Tobacco Use - No.   Alcohol Use - yes    Physical Exam  General:  alert, well-developed, and well-nourished.   Lungs:  normal respiratory effort and normal breath sounds.   Heart:  normal rate, regular rhythm, and no gallop.   Extremities:  No lower extremity edema  Neurologic:  cranial nerves II-XII intact.   Psych:  normally interactive and good eye contact.     Impression & Recommendations:  Problem # 1:  DIABETES MELLITUS, TYPE II, UNCONTROLLED (ICD-250.02) Pt experiencing exacerbation after receiving steroid injections for low back pain.  we discussed using novolog sliding scale short term. Her blood sugars did well for 2-4 wks when dietary compliance and exercise was optimal. she may benefit from insulin pump.  refer to endo.  Her updated medication list for this problem includes:    Lantus Solostar 100 Unit/ml Soln (Insulin glargine) .Marland KitchenMarland KitchenMarland KitchenMarland Kitchen 60-80 units once daily    Novolog Flexpen 100 Unit/ml Soln (Insulin aspart) .Marland KitchenMarland KitchenMarland KitchenMarland Kitchen 15-30 units 15-30 mins before meals tid    Lisinopril 20 Mg  Tabs (Lisinopril) ..... One by mouth qd    Metformin Hcl 500 Mg Tabs (Metformin hcl) ..... One by mouth bid    Aspirin 81 Mg Tbec (Aspirin) ..... One by mouth qd  Orders: Endocrinology Referral (Endocrine)  Problem # 2:  HYPERLIPIDEMIA (ICD-272.4) Assessment: Unchanged pt having bilateral thigh pain at night after using crestor regularly (even reduced dose).  pt will try taking every other day or every 3 days.  goal LDL < 100 Her updated medication list for this problem includes:    Crestor 20 Mg Tabs (Rosuvastatin calcium) ..... One by mouth once daily  Problem # 3:  LOW BACK PAIN,  CHRONIC (ICD-724.2)  She has seen Dr. Alveda Reasons in the past.  She underwent lumbar decompression surgery in feb of 2010.  She has having recurrent back issues.  We discussed second opinion with neurosurgeon.   she would like to avoid chronic narcotics / pain mgt trial of gabapentin The following medications were removed from the medication list:    Tramadol Hcl 50 Mg Tabs (Tramadol hcl) .Marland Kitchen... Take 1 tablet by mouth two times a day as needed    Vicodin 5-500 Mg Tabs (Hydrocodone-acetaminophen) .Marland Kitchen... Take 1 tablet by mouth once a day as needed back pain Her updated medication list for this problem includes:    Aspirin 81 Mg Tbec (Aspirin) ..... One by mouth qd  Orders: Neurosurgeon Referral (Neurosurgeon)  Complete Medication List: 1)  Lantus Solostar 100 Unit/ml Soln (Insulin glargine) .... 60-80 units once daily 2)  Novolog Flexpen 100 Unit/ml Soln (Insulin aspart) .Marland KitchenMarland KitchenMarland Kitchen 15-30 units 15-30 mins before meals tid 3)  Lisinopril 20 Mg Tabs (Lisinopril) .... One by mouth qd 4)  Multivitamins Tabs (Multiple vitamin) .... Take 1 tablet by mouth once a day 5)  Metformin Hcl 500 Mg Tabs (Metformin hcl) .... One by mouth bid 6)  Aspirin 81 Mg Tbec (Aspirin) .... One by mouth qd 7)  Bd Pen Needle Ultrafine 29g X 12.45mm Misc (Insulin pen needle) .... Use for insulin injection 4 times a day as directed 8)  Crestor 20 Mg Tabs (Rosuvastatin calcium) .... One by mouth once daily 9)  Amlodipine Besylate 2.5 Mg Tabs (Amlodipine besylate) .... One by mouth once daily 10)  Gabapentin 300 Mg Caps (Gabapentin) .... One by mouth qhs  Patient Instructions: 1)  Please schedule a follow-up appointment in 1 month. Prescriptions: GABAPENTIN 300 MG CAPS (GABAPENTIN) one by mouth qhs  #30 x 1   Entered and Authorized by:   D. Thomos Lemons DO   Signed by:   D. Thomos Lemons DO on 06/02/2009   Method used:   [facility] to        [id number]       [address]       [place], [place]   [address]       Ph: [phone] or [phone]       Fax: [phone]   RxID:   [id number]   Current Allergies (reviewed today): No known allergies

## 2010-03-24 NOTE — Letter (Signed)
Summary: Primary Care Consult Scheduled Letter  [place] [facility]  [address]   [place], [place] [address]   Phone: [phone]  Fax: [phone]      02/24/2009 MRN: [id number]  Noa Hornstein [address] [place], [place]  [address]    Dear Ms. Lore,      We have scheduled an appointment for you.  At the recommendation of Dr.Yoo, we have scheduled you a consult with Dr Jens Som,  Cecilio Asper on January 19th 2011 at [facility] .  Their address is [address] [place]. The office phone number is [phone].  If this appointment day and time is not convenient for you, please feel free to call the office of the doctor you are being referred to at the number listed above and reschedule the appointment.     It is important for you to keep your scheduled appointments. We are here to make sure you are given good patient care. If you have questions or you have made changes to your appointment, please notify us at  [phone], ask for [facility].    Thank you,  Patient Care Coordinator [place] [facility]

## 2010-03-24 NOTE — Progress Notes (Signed)
Summary: Welchol Refill  Phone Note Refill Request Message from:  Fax from Pharmacy on May 05, 2009 1:49 PM  Refills Requested: Medication #1:  welchol 625 mg   Brand Name Necessary? No   Supply Requested: 1 month   Last Refilled: 02/06/2009 [facility] [address] [place] [address] phone [phone] fax [phone]   Method Requested: Electronic Next Appointment Scheduled: 05-12-09 815 lab  Initial call taken by: Roselle Locus,  May 05, 2009 1:51 PM  Follow-up for Phone Call        call placed to [phone], [facility]-unidentified female  stated  I have the wrong number, call placed to patient at [phone], message left for patient to return call regarding medication refill request.  Need to know if patient is still taking the [facility], refill request received from pharmacy Follow-up by: Glendell Docker CMA,  May 05, 2009 4:49 PM  Additional Follow-up for Phone Call Additional follow up Details #1::        no return call from patient Additional Follow-up by: Glendell Docker CMA,  May 07, 2009 11:40 AM

## 2010-03-24 NOTE — Progress Notes (Signed)
Summary: Cancelled appt  Phone Note Call from Patient Call back at Home Phone [phone]   Caller: Patient Call For: D. Thomos Lemons DO Summary of Call: Just an FYI- pt called in to cancel tomorrow's appt (07/03/09), stated she was not taking the new med which was the reason for the appt. Initial call taken by: Lannette Donath,  Jul 02, 2009 9:55 AM  Follow-up for Phone Call        I suggest she reschedule appt 2 - 4 wks after starting gabapentin Follow-up by: D. Thomos Lemons DO,  Jul 02, 2009 9:32 PM  Additional Follow-up for Phone Call Additional follow up Details #1::        call placed to patient at [phone], no answer, detailed voice message left informing patient per Dr Artist Pais instructions Additional Follow-up by: Glendell Docker CMA,  Jul 03, 2009 1:17 PM

## 2010-03-24 NOTE — Miscellaneous (Signed)
Summary: Orders Update  Clinical Lists Changes  Orders: Added new Test order of TLB-BMP (Basic Metabolic Panel-BMET) (80048-METABOL) - Signed Added new Test order of TLB-A1C / Hgb A1C (Glycohemoglobin) (83036-A1C) - Signed Added new Test order of TLB-ALT (SGPT) (84460-ALT) - Signed Added new Test order of TLB-AST (SGOT) (84450-SGOT) - Signed Added new Test order of TLB-Lipid Panel (80061-LIPID) - Signed

## 2010-03-24 NOTE — Assessment & Plan Note (Signed)
Summary: 3 WEEK FOLLOW UP/MHF   Vital Signs:  Patient profile:   45 year old female Weight:      213.50 pounds BMI:     33.56 Temp:     98.1 degrees F oral Pulse rate:   80 / minute Pulse rhythm:   regular Resp:     16 per minute BP sitting:   140 / 80  (left arm) Cuff size:   large  Vitals Entered By: Glendell Docker CMA (November 19, 2008 4:52 PM)  Primary Care Provider:  Dondra Spry DO  CC:  3 week follow up  and Type 2 diabetes mellitus follow-up.  History of Present Illness: 3 week follow up   Type 2 Diabetes Mellitus Follow-Up      This is a 45 year old woman who presents for Type 2 diabetes mellitus follow-up.  The patient denies self managed hypoglycemia and weight gain.  The patient denies the following symptoms: chest pain.  Since the last visit the patient reports good dietary compliance, compliance with medications, and monitoring blood glucose.  She is much more compliant with diabetic diet.   Elevated CBG 395 x 1 after eating salad with ranch dressing.   CBG seems to be higher during evening hours.    Allergies: No Known Drug Allergies  Past History:  Past Medical History: Depression-counseling 2005 inflammatory bowel syndrome  Diabetes mellitus, type II Headache- daily  Hypertension Hyperlipidemia History of Blood Clot 2000 Hx of chronic low back pain  Past Surgical History: Back Surgey Feb 2010 - Dr. Alveda Reasons  oophorectomy 2008 1990- Hysterecotomy   Family History: Family History of Alcoholism/Addiction Family History of Arthritis Family History Breast cancer 1st degree relative <50 Family History of CAD Female 1st degree relative <60 Family History of Colon CA 1st degree relative <60 Family History Diabetes 1st degree relative Family History High cholesterol Family History Hypertension Family History Kidney disease Family History Ovarian cancer Family History of Prostate CA 1st degree relative <50 Family History Psychiatric care Family  History of Stroke F 1st degree relative <60 Family History of Stroke M 1st degree relative <50      Social History: Occupation: Paediatric nurse Married 9 years  2 daughters 79, 48     Physical Exam  General:  alert, well-developed, and well-nourished.   Neck:  supple and no masses.  no tenderness. No carotid bruit Lungs:  normal respiratory effort and normal breath sounds.   Heart:  normal rate, regular rhythm, no murmur, and no gallop.   Extremities:  No lower extremity edema  Neurologic:  cranial nerves II-XII intact and gait normal.   Psych:  normally interactive and good eye contact.    Diabetes Management Exam:    Foot Exam (with socks and/or shoes not present):       Inspection:          Left foot: normal          Right foot: normal   Impression & Recommendations:  Problem # 1:  DIABETES MELLITUS, TYPE II (ICD-250.00) Pt advised to increase novolog dose before evening meal.  Start metformin. Hopefully we can reduce amt of insulin use/insulin resistance.  Pt advised to monitor for hypoglycemia.   Her updated medication list for this problem includes:    Lantus Solostar 100 Unit/ml Soln (Insulin glargine) .Marland KitchenMarland KitchenMarland KitchenMarland Kitchen 60-80 units once daily    Novolog Flexpen 100 Unit/ml Soln (Insulin aspart) .Marland KitchenMarland KitchenMarland KitchenMarland Kitchen 15-30 units 15-30 mins before meals tid    Lisinopril 20 Mg Tabs (  Lisinopril) ..... One by mouth qd    Metformin Hcl 500 Mg Tabs (Metformin hcl) ..... One by mouth bid    Aspirin 81 Mg Tbec (Aspirin) ..... One by mouth qd  Future Orders: TLB-A1C / Hgb A1C (Glycohemoglobin) (83036-A1C) ... 02/10/2009 TLB-Microalbumin/Creat Ratio, Urine (82043-MALB) ... 02/10/2009  Problem # 2:  HYPERTENSION (ICD-401.9) BP suboptimal.  Increase lisinopril to 20 mg.  Her updated medication list for this problem includes:    Lisinopril 20 Mg Tabs (Lisinopril) ..... One by mouth qd  Future Orders: TLB-BMP (Basic Metabolic Panel-BMET) (80048-METABOL) ... 02/10/2009  BP today: 140/80 Prior BP:  108/72 (10/29/2008)  Complete Medication List: 1)  Lantus Solostar 100 Unit/ml Soln (Insulin glargine) .... 60-80 units once daily 2)  Novolog Flexpen 100 Unit/ml Soln (Insulin aspart) .Marland KitchenMarland KitchenMarland Kitchen 15-30 units 15-30 mins before meals tid 3)  Lisinopril 20 Mg Tabs (Lisinopril) .... One by mouth qd 4)  Multivitamins Tabs (Multiple vitamin) .... Take 1 tablet by mouth once a day 5)  Insulin Syringe/needle 28g X 1/2" 1 Ml Misc (Insulin syringe-needle u-100) .... Use daily as directed 6)  Tramadol Hcl 50 Mg Tabs (Tramadol hcl) .... Take 1 tablet by mouth two times a day as needed 7)  Vicodin 5-500 Mg Tabs (Hydrocodone-acetaminophen) .... Take 1 tablet by mouth once a day as needed back pain 8)  Metformin Hcl 500 Mg Tabs (Metformin hcl) .... One by mouth bid 9)  Aspirin 81 Mg Tbec (Aspirin) .... One by mouth qd 10)  Bd Pen Needle Ultrafine 29g X 12.24mm Misc (Insulin pen needle) .... Use 4 x per day as directed  Other Orders: Future Orders: TLB-Hepatic/Liver Function Pnl (80076-HEPATIC) ... 02/10/2009 TLB-Lipid Panel (80061-LIPID) ... 02/10/2009 TLB-TSH (Thyroid Stimulating Hormone) (84443-TSH) ... 02/10/2009  Patient Instructions: 1)  Please schedule a follow-up appointment in 3 months. 2)  BMP prior to visit, ICD-9:  401.9 3)  Hepatic Panel prior to visit, ICD-9:  272.4 4)  Lipid Panel prior to visit, ICD-9:  272.4 5)  TSH prior to visit, ICD-9:  272.4 6)  HbgA1C prior to visit, ICD-9:  250.02 7)  Urine Microalbumin prior to visit, ICD-9:  250.02 8)  Please return for lab work one (1) week before your next appointment.  Prescriptions: BD PEN NEEDLE ULTRAFINE 29G X 12.7MM MISC (INSULIN PEN NEEDLE) use 4 x per day as directed  #160 x 5   Entered and Authorized by:   D. Thomos Lemons DO   Signed by:   D. Thomos Lemons DO on 11/19/2008   Method used:   [id number]       [address] [place], [place]  [address]       Ph: [phone] or  [phone]       Fax: [phone]   RxID:   [id number] LISINOPRIL 20 MG TABS (LISINOPRIL) one by mouth qd  #30 x 3   Entered and Authorized by:   D. Thomos Lemons DO   Signed by:   D. Thomos Lemons DO on 11/19/2008   Method used:   [facility]        [id number]       [address]       [place], [place]  [address]       Ph: [phone] or [phone]  Fax: [phone]   RxID:   [id number] LANTUS SOLOSTAR 100 UNIT/ML SOLN (INSULIN GLARGINE) 60-80 units once daily  #1 month x 2   Entered and Authorized by:   D. Thomos Lemons DO   Signed by:   D. Thomos Lemons DO on 11/19/2008   Method used:   [facility]        [id number]       [address]       [place], [place]  [address]       Ph: [phone] or [phone]       Fax: [phone]   RxID:   [id number] METFORMIN HCL 500 MG TABS (METFORMIN HCL) one by mouth bid  #60 x 2   Entered and Authorized by:   D. Thomos Lemons DO   Signed by:   D. Thomos Lemons DO on 11/19/2008   Method used:   [facility]        [id number]       [address]       [place], [place]  [address]       Ph: [phone] or [phone]       Fax: [phone]   RxID:   [id number]

## 2010-03-24 NOTE — Assessment & Plan Note (Signed)
Summary: 1 mo. f/u - jr   Vital Signs:  Patient profile:   45 year old female Weight:      216.50 pounds BMI:     34.03 O2 Sat:      97 % on Room air Temp:     98.2 degrees F oral Pulse rate:   86 / minute Pulse rhythm:   regular Resp:     16 per minute BP sitting:   116 / 74  (right arm) Cuff size:   large  Vitals Entered By: Glendell Docker CMA (March 20, 2009 3:43 PM)  O2 Flow:  Room air  Primary Care Provider:  D. Thomos Lemons DO  CC:  Follow up disease management and Type 2 diabetes mellitus follow-up.  History of Present Illness: Follow up disease management  Type 2 Diabetes Mellitus Follow-Up      This is a 45 year old woman who presents for Type 2 diabetes mellitus follow-up.  The patient denies self managed hypoglycemia and hypoglycemia requiring help.  The patient denies the following symptoms: chest pain.  Since the last visit the patient reports good dietary compliance, exercising regularly, and monitoring blood glucose.  low blood sugar 89 high 264 avg 140-160 she started exercise program  hyperlipidemia - leg pains with crestor.  better with reduced dose  Allergies (verified): No Known Drug Allergies  Past History:  Past Medical History: Current Problems:  HYPERLIPIDEMIA (ICD-272.4) HYPERTENSION (ICD-401.9) DIABETES MELLITUS, TYPE II, UNCONTROLLED (ICD-250.02) DEPRESSION (ICD-311) inflammatory bowel syndrome  Headache- daily  History of Blood Clot 2000  Hx of chronic low back pain  Past Surgical History: Back Surgey Feb 2010 - Dr. Alveda Reasons  oophorectomy 2008 1990- Hysterecotomy   H/O laporoscopy for endometriosis   Family History: Family History of Alcoholism/Addiction Family History of Arthritis Family History Breast cancer 1st degree relative <50 Family History of CAD Female 1st degree relative <60 Family History of Colon CA 1st degree relative <60 Family History Diabetes 1st degree relative Family History High cholesterol Family History  Hypertension Family History Kidney disease Family History Ovarian cancer Family History of Prostate CA 1st degree relative <50 Family History Psychiatric care Family History of Stroke F 1st degree relative <60 Family History of Stroke M 1st degree relative <50   Mother with MI at 11       Social History: Occupation: Paediatric nurse Married 9 years  2 daughters 76, 37  Tobacco Use - No.  Alcohol Use - yes    Physical Exam  General:  alert, well-developed, and well-nourished.   Lungs:  normal respiratory effort and normal breath sounds.   Heart:  normal rate, regular rhythm, no murmur, and no gallop.   Extremities:  No lower extremity edema    Impression & Recommendations:  Problem # 1:  DIABETES MELLITUS, TYPE II, UNCONTROLLED (ICD-250.02) she has noticed significant improvement in blood sugars with regular exercise.  diabetes counseling helpful. We discussed importance of avoiding hypoglycemia.  she understands to lower lantus dose in AM cbg < 130's  Her updated medication list for this problem includes:    Lantus Solostar 100 Unit/ml Soln (Insulin glargine) .Marland KitchenMarland KitchenMarland KitchenMarland Kitchen 60-80 units once daily    Novolog Flexpen 100 Unit/ml Soln (Insulin aspart) .Marland KitchenMarland KitchenMarland KitchenMarland Kitchen 15-30 units 15-30 mins before meals tid    Lisinopril 20 Mg Tabs (Lisinopril) ..... One by mouth qd    Metformin Hcl 500 Mg Tabs (Metformin hcl) ..... One by mouth bid    Aspirin 81 Mg Tbec (Aspirin) ..... One by mouth qd  Labs Reviewed: Creat: 0.74 (02/13/2009)    Reviewed HgBA1c results: 10.4 (02/13/2009)  Problem # 2:  HYPERTENSION (ICD-401.9) Assessment: Improved BP at goal.  Maintain current medication regimen.  Her updated medication list for this problem includes:    Lisinopril 20 Mg Tabs (Lisinopril) ..... One by mouth qd    Amlodipine Besylate 2.5 Mg Tabs (Amlodipine besylate) ..... One by mouth once daily  BP today: 116/74 Prior BP: 135/92 (03/12/2009)  Labs Reviewed: K+: 4.2 (02/13/2009) Creat: : 0.74  (02/13/2009)   Chol: 247 (02/13/2009)   HDL: 40 (02/13/2009)   LDL: 172 (02/13/2009)   TG: 175 (02/13/2009)  Problem # 3:  HYPERLIPIDEMIA (ICD-272.4) goal LDL < 100.  full dose crestor is causing muscle symptoms.  better with 1/2 dose  Her updated medication list for this problem includes:    Crestor 20 Mg Tabs (Rosuvastatin calcium) ..... One by mouth once daily  Labs Reviewed: SGOT: 19 (02/13/2009)   SGPT: 29 (02/13/2009)   HDL:40 (02/13/2009)  LDL:172 (02/13/2009)  Chol:247 (02/13/2009)  Trig:175 (02/13/2009)  Complete Medication List: 1)  Lantus Solostar 100 Unit/ml Soln (Insulin glargine) .... 60-80 units once daily 2)  Novolog Flexpen 100 Unit/ml Soln (Insulin aspart) .Marland KitchenMarland KitchenMarland Kitchen 15-30 units 15-30 mins before meals tid 3)  Lisinopril 20 Mg Tabs (Lisinopril) .... One by mouth qd 4)  Multivitamins Tabs (Multiple vitamin) .... Take 1 tablet by mouth once a day 5)  Tramadol Hcl 50 Mg Tabs (Tramadol hcl) .... Take 1 tablet by mouth two times a day as needed 6)  Vicodin 5-500 Mg Tabs (Hydrocodone-acetaminophen) .... Take 1 tablet by mouth once a day as needed back pain 7)  Metformin Hcl 500 Mg Tabs (Metformin hcl) .... One by mouth bid 8)  Aspirin 81 Mg Tbec (Aspirin) .... One by mouth qd 9)  Bd Pen Needle Ultrafine 29g X 12.44mm Misc (Insulin pen needle) .... Use for insulin injection 4 times a day as directed 10)  Crestor 20 Mg Tabs (Rosuvastatin calcium) .... One by mouth once daily 11)  Amlodipine Besylate 2.5 Mg Tabs (Amlodipine besylate) .... One by mouth once daily  Patient Instructions: 1)  Please schedule a follow-up appointment in 2 months. 2)  BMP prior to visit, ICD-9:  250.02 3)  HbgA1C prior to visit, ICD-9: 250.02 4)  Lipid Panel prior to visit, ICD-9: 272.4 5)  AST, ALT prior to visit, ICD-9: 272.4 6)  Please return for lab work one (1) week before your next appointment.   Current Allergies (reviewed today): No known allergies    Immunization History:  Influenza  Immunization History:    Influenza:  historical (11/05/2008)  Tetanus/Td Immunization History:    Tetanus/Td:  historical (05/03/2006)

## 2010-03-24 NOTE — Assessment & Plan Note (Signed)
Summary: TO EST /SEE IN ER /DIABETES/HEA   Vital Signs:  Patient profile:   45 year old female Height:      67 inches Weight:      210.50 pounds BMI:     33.09 Temp:     97.8 degrees F oral Pulse rate:   88 / minute Pulse rhythm:   regular Resp:     20 per minute BP sitting:   108 / 72  (right arm) Cuff size:   large  Vitals Entered By: Glendell Docker CMA (October 29, 2008 1:37 PM)  Primary Care Provider:  Dondra Spry DO   History of Present Illness: 45 year old white female to establish. Patient recently seen at emergency room secondary to complaints of dizziness. CT of brain was negative. Patient complained of left-sided headaches.  Patient has history of type 2 diabetes. Her blood sugars have been poorly controlled. Her patient reports not paying attention to her health issues secondary to her daughter's health problems. Daughter known to have lupus with renal involvement. Her blood sugars have been as high as 400-500 at home. Patient admits to poor dietary compliance. Patient typically uses Levemir 75 units per day. And NovoLog 16 units 3 times a day before meals. Patient has long history of type 2 diabetes. Medications were started in 2002. She has history of gestational diabetes.   Patient also has history of hypertension. Patient has been on ACE inhibitor. Medication compliance has been good. She denies history of coronary disease or stroke. However she has strong family history of coronary artery disease. Father had MI at age 98. Status post CABG.  Preventive Screening-Counseling & Management  Alcohol-Tobacco     Alcohol drinks/day: 0     Alcohol Counseling: not indicated; patient does not drink     Smoking Status: never     Tobacco Counseling: not indicated; no tobacco use  Caffeine-Diet-Exercise     Caffeine use/day: 2 liters daily up to 2 weeks ago     Caffeine Counseling: STOP use of caffeine     Does Patient Exercise: yes     Type of exercise: walking  Times/week: 5  Allergies (verified): No Known Drug Allergies  Past History:  Past Medical History: Depression-counseling 2005 inflammatory bowel syndrome Diabetes mellitus, type II Headache- daily  Hypertension Hyperlipidemia History of Blood Clot 2000 Hx of chronic low back pain  Past Surgical History: Back Surgey Feb 2010 - Dr. Alveda Reasons oophorectomy 2008 1990- Hysterecotomy   Family History: Family History of Alcoholism/Addiction Family History of Arthritis Family History Breast cancer 1st degree relative <50 Family History of CAD Female 1st degree relative <60 Family History of Colon CA 1st degree relative <60 Family History Diabetes 1st degree relative Family History High cholesterol Family History Hypertension Family History Kidney disease Family History Ovarian cancer Family History of Prostate CA 1st degree relative <50 Family History Psychiatric care Family History of Stroke F 1st degree relative <60 Family History of Stroke M 1st degree relative <50   Social History: Occupation: Paediatric nurse Married 9 years  2 daughters 21, 24   Smoking Status:  never Caffeine use/day:  2 liters daily up to 2 weeks ago Does Patient Exercise:  yes  Review of Systems  The patient denies weight loss, weight gain, chest pain, syncope, dyspnea on exertion, prolonged cough, abdominal pain, melena, hematochezia, severe indigestion/heartburn, and depression.         All other systems were reviewed and were negative.   Physical Exam  General:  alert, well-developed, and well-nourished.   Head:  normocephalic and atraumatic.   Eyes:  vision grossly intact, pupils equal, pupils round, and pupils reactive to light.   Ears:  R ear normal and L ear normal.   Mouth:  Oral mucosa and oropharynx without lesions or exudates.   Neck:  supple and no masses.  no tenderness. No carotid bruit Lungs:  normal respiratory effort and normal breath sounds.   Heart:  normal rate, regular  rhythm, no murmur, and no gallop.   Abdomen:  soft, non-tender, normal bowel sounds, no hepatomegaly, and no splenomegaly.   Psych:  normally interactive, good eye contact, not anxious appearing, and not depressed appearing.    Diabetes Management Exam:    Foot Exam (with socks and/or shoes not present):       Inspection:          Left foot: normal          Right foot: normal   Impression & Recommendations:  Problem # 1:  DIABETES MELLITUS, TYPE II (ICD-19.39) 45 year old with uncontrolled type 2 diabetes. We stressed importance of dietary compliance. Change levemir to Lantus.  We discussed possibility of hypoglycemia as patient's dietary compliance improves.  Monitor A1c.  We discussed potential microvascular and macrovascular complications of uncontrolled diabetes.  Pt updated with pneumovax.  She will need diabetic eye exam.   Her updated medication list for this problem includes:    Lantus 100 Unit/ml Soln (Insulin glargine) .Marland KitchenMarland KitchenMarland KitchenMarland Kitchen 60-80 units once daily    Novolog Flexpen 100 Unit/ml Soln (Insulin aspart) .Marland KitchenMarland KitchenMarland KitchenMarland Kitchen 15-30 units 15-30 mins before meals tid    Lisinopril 10 Mg Tabs (Lisinopril) .Marland Kitchen... Take 1 tablet by mouth once a day  Problem # 2:  HYPERTENSION (ICD-401.9) Well controlled.  Maintain current medication regimen.  Her updated medication list for this problem includes:    Lisinopril 10 Mg Tabs (Lisinopril) .Marland Kitchen... Take 1 tablet by mouth once a day  BP today: 108/72  Problem # 3:  HYPERLIPIDEMIA (ICD-272.4) Obtain FLP.  Goal LDL < 70.    Complete Medication List: 1)  Lantus 100 Unit/ml Soln (Insulin glargine) .... 60-80 units once daily 2)  Novolog Flexpen 100 Unit/ml Soln (Insulin aspart) .Marland KitchenMarland KitchenMarland Kitchen 15-30 units 15-30 mins before meals tid 3)  Lisinopril 10 Mg Tabs (Lisinopril) .... Take 1 tablet by mouth once a day 4)  Multivitamins Tabs (Multiple vitamin) .... Take 1 tablet by mouth once a day 5)  Insulin Syringe/needle 28g X 1/2" 1 Ml Misc (Insulin syringe-needle u-100) ....  Use daily as directed  Other Orders: Pneumococcal Vaccine ([address]) Admin 1st Vaccine (98119)  Patient Instructions: 1)  Please schedule a follow-up appointment in 2 weeks. 2)  BMP prior to visit, ICD-9:  250.02 3)  Hepatic Panel prior to visit, ICD-9:  250.02 4)  Lipid Panel prior to visit, ICD-9:  250.02 5)  TSH prior to visit, ICD-9: 250.02 6)  HbgA1C prior to visit, ICD-9: 250.02 7)  Urine Microalbumin prior to visit, ICD-9: 250.02 8)  Please return for lab work one (1) week before your next appointment.  Prescriptions: LISINOPRIL 10 MG TABS (LISINOPRIL) Take 1 tablet by mouth once a day  #30 x 2   Entered and Authorized by:   D. Thomos Lemons DO   Signed by:   D. Thomos Lemons DO on 10/29/2008   Method used:   [facility]        [id number]       [address]  [place], [place]  [address]       Ph: [phone] or [phone]       Fax: [phone]   RxID:   [id number] INSULIN SYRINGE/NEEDLE 28G X 1/2" 1 ML MISC (INSULIN SYRINGE-NEEDLE U-100) use daily as directed  #100 x 3   Entered and Authorized by:   D. Thomos Lemons DO   Signed by:   D. Thomos Lemons DO on 10/29/2008   Method used:   [facility]ically to        [id number]       [address] [place], [place]  [address]       Ph: [phone] or [phone]       Fax: [phone]   RxID:   [id number] NOVOLOG FLEXPEN 100 UNIT/ML SOLN (INSULIN ASPART) 15-30 units 15-30 mins before meals tid  #1 month x 1   Entered and Authorized by:   D. Thomos Lemons DO   Signed by:   D. Thomos Lemons DO on 10/29/2008   Method used:   [facility]        [id number]       [address] [place], [place]  [address]       Ph: [phone] or [phone]       Fax: [phone]   RxID:   [id number] LANTUS 100 UNIT/ML SOLN (INSULIN GLARGINE) 60-80 units once daily  #1 month x 1   Entered and Authorized by:   D. Thomos Lemons DO    Signed by:   D. Thomos Lemons DO on 10/29/2008   Method used:   [facility]        [id number]       [address]       [place], [place]  [address]       Ph: [phone] or [phone]       Fax: [phone]   RxID:   [id number]    Preventive Care Screening  Pap Smear:    Date:  09/02/2006    Results:  normal   Mammogram:    Date:  08/04/2005    Results:  normal   Colonoscopy:    Date:  11/13/2002    Results:  abnormal     Current Allergies (reviewed today): No known allergies    Immunizations Administered:  Pneumonia Vaccine:    Vaccine Type: Pneumovax    Site: left deltoid    Mfr: Merck    Dose: 0.5 ml    Route: IM    Given by: Glendell Docker CMA    Exp. Date: 11/30/2009    Lot #: [id number]    VIS given: 12/01/2007

## 2010-03-24 NOTE — Progress Notes (Signed)
Summary: Pen Needle Refill  Phone Note From Pharmacy Call back at [phone]   [facility]er: [id number] Summary of Call: [facility]ist from [facility] called and left voice message stating they received a rx for  syringes for patient for insulin injection, but she used the pen needles and a if a rx for the pen needles could be sent in place of Syringes Initial call taken by: Glendell Docker CMA,  February 26, 2009 5:55 PM  Follow-up for Phone Call        Phone call completed, new rx sent Follow-up by: Glendell Docker CMA,  February 26, 2009 5:58 PM    New/Updated Medications: BD PEN NEEDLE ULTRAFINE 29G X 12.7MM MISC (INSULIN PEN NEEDLE) use for insulin injection 4 times a day as directed Prescriptions: BD PEN NEEDLE ULTRAFINE 29G X 12.7MM MISC (INSULIN PEN NEEDLE) use for insulin injection 4 times a day as directed  #120 x 3   Entered by:   Glendell Docker CMA   Authorized by:   D. Thomos Lemons DO   Signed by:   Glendell Docker CMA on 02/26/2009   Method used:   [facility]        [id number]       [address]       [place], [place]  [address]       Ph: [phone] or [phone]       Fax: [phone]   RxID:   [id number]

## 2010-03-24 NOTE — Letter (Signed)
Summary: Primary Care Consult Scheduled Letter  [facility]  [address]   [place], [place] [address]   Phone: [phone]  Fax: [phone]      06/04/2009 MRN: [id number]  [name] Figuero [address] [place], [place]  [address]    Dear [name],      We have scheduled an appointment for you.  At the recommendation of Dr.YOO, we have scheduled you a consult with DR Talmage Nap, ENDOCRINOLIST @ [facility]  on MAY 18TH  at 10:30AM .  Their address is[address] ,[place] . The office phone number is [phone].  If this appointment day and time is not convenient for you, please feel free to call the office of the doctor you are being referred to at the number listed above and reschedule the appointment.     It is important for you to keep your scheduled appointments. We are here to make sure you are given good patient care. If you have questions or you have made changes to your appointment, please notify us at  [phone], ask for HELEN.    Thank you,  Darral Dash Patient Care Coordinator [facility]

## 2010-04-09 NOTE — Op Note (Signed)
  NAME:  [name] [name], [name] [name]                   ACCOUNT NO.:  [id number]  MEDICAL RECORD NO.:  [id number]          PATIENT TYPE:  INP  LOCATION:  2550                         FACILITY:  [facility]  PHYSICIAN:  Suzanna Obey, M.D.       DATE OF BIRTH:  03-Dec-1965  DATE OF PROCEDURE:  02/24/2010 DATE OF DISCHARGE:                              OPERATIVE REPORT   PREOPERATIVE DIAGNOSIS:  Lower and upper lip laceration with 5-cm lower lip laceration and 0.5-cm upper lip laceration, complex in both areas as it is through the vermilion border.  POSTOPERATIVE DIAGNOSIS:  Lower and upper lip laceration with 5-cm lower lip laceration and 0.5-cm upper lip laceration, complex in both areas as it is through the vermilion border.  SURGICAL PROCEDURE:  Closure of upper lip laceration and advancement rotation flap and closure of lower lip laceration.  ANESTHESIA:  General.  ESTIMATED BLOOD LOSS:  Less than 10 mL.  INDICATION OF PROCEDURE:  A 45 year old who was on a treadmill last night at 10 o'clock and fell and graded her lip across a metal plate that resulted in loss of tissue of the lower lip.  Attempt was made to close this in the emergency room, but it was immediately realized that this was not just a splaying of the lip, it was actual tissue loss, requiring a rotation flap.  The patient was informed of the risks and benefits of the procedure and options were discussed.  All questions were answered and consent was obtained.  OPERATION:  The patient was taken to the operating room, placed in supine position after general endotracheal tube anesthesia was placed in the supine position, prepped and draped in the usual sterile manner. The upper lip lesion was through the vermilion border, was lined up perfectly, closed with interrupted 4-0 chromic.  The lower lip was much more complicated with a really jagged edge along the vermilion border with some of the jagged edge extending below the vermilion border.   A flap was elevated into the mouth with mucosa and then laterally to the right, a flap was elevated and rotated along the edge of the vermilion border with the jagged edge trimmed to close the edge of the vermilion border with no tension.  A flap from inside the mouth was brought up to additionally close the exposed muscle edge.  The multiple other areas were closed with interrupted 4-0 chromic, all closed with interrupted 4- 0 chromic and the vermilion border where it was just cut through in a jagged fashion was closed with an interrupted 5-0 nylon.  This looked to be the best alignment of the vermilion border after all this was repaired.  The lip looked symmetrical and the wound was irrigated with saline, and the patient was then awakened, brought to recovery in stable condition.  Counts correct.          ______________________________ Suzanna Obey, M.D.     JB/MEDQ  D:  02/24/2010  T:  02/24/2010  Job:  [id number]  Electronically Signed by Suzanna Obey M.D. on 04/09/2010 10:15:44 AM

## 2010-05-01 NOTE — Discharge Summary (Signed)
  NAME:  Doring, Sarinah                   ACCOUNT NO.:  [id number]  MEDICAL RECORD NO.:  [id number]          PATIENT TYPE:  INP  LOCATION:  5024                         FACILITY:  [facility]  PHYSICIAN:  Suzanna Obey, M.D.       DATE OF BIRTH:  [birth year]  DATE OF ADMISSION:  02/24/2010 DATE OF DISCHARGE:  02/24/2010                              DISCHARGE SUMMARY   ADMISSION DIAGNOSIS:  Complex lip laceration.  DISCHARGE DIAGNOSIS:  Complex lip laceration.  HISTORY OF PRESENT ILLNESS:  This is a 45 year old who was injured with a fall and tore her bottom lip and upper lip and had it repaired in the operating room.  She had a increased sugar level from her diabetes, for which recommendations and control were made by consult with the hospital service.  She was discharged the same day to follow up with me in 1 week or sooner if any infection, for which wound instructions were given.          ______________________________ Suzanna Obey, M.D.JB/MEDQ  D:  04/09/2010  T:  04/09/2010  Job:  [id number]  Electronically Signed by Suzanna Obey M.D. on 04/30/2010 08:19:34 AM

## 2010-05-04 LAB — POCT I-STAT, CHEM 8
BUN: 8 mg/dL (ref 6–23)
Calcium, Ion: 1.18 mmol/L (ref 1.12–1.32)
Chloride: 103 mEq/L (ref 96–112)
Creatinine, Ser: 0.6 mg/dL (ref 0.4–1.2)
Glucose, Bld: 449 mg/dL — ABNORMAL HIGH (ref 70–99)
HCT: 44 % (ref 36.0–46.0)
Hemoglobin: 15 g/dL (ref 12.0–15.0)
Potassium: 4 mEq/L (ref 3.5–5.1)
Sodium: 137 mEq/L (ref 135–145)
TCO2: 23 mmol/L (ref 0–100)

## 2010-05-04 LAB — BASIC METABOLIC PANEL
BUN: 7 mg/dL (ref 6–23)
CO2: 26 mEq/L (ref 19–32)
Calcium: 8.8 mg/dL (ref 8.4–10.5)
Chloride: 103 mEq/L (ref 96–112)
Creatinine, Ser: 0.65 mg/dL (ref 0.4–1.2)
GFR calc Af Amer: >60 mL/min (ref >60)
GFR calc non Af Amer: >60 mL/min (ref >60)
Glucose, Bld: 333 mg/dL — ABNORMAL HIGH (ref 70–99)
Potassium: 4.2 mEq/L (ref 3.5–5.1)
Sodium: 140 mEq/L (ref 135–145)

## 2010-05-04 LAB — GLUCOSE, CAPILLARY
Glucose-Capillary: 183 mg/dL — ABNORMAL HIGH (ref 70–99)
Glucose-Capillary: 291 mg/dL — ABNORMAL HIGH (ref 70–99)
Glucose-Capillary: 328 mg/dL — ABNORMAL HIGH (ref 70–99)
Glucose-Capillary: 347 mg/dL — ABNORMAL HIGH (ref 70–99)

## 2010-05-30 LAB — DIFFERENTIAL
Basophils Absolute: 0 10*3/uL (ref 0.0–0.1)
Basophils Relative: 1 % (ref 0–1)
Eosinophils Absolute: 0.1 10*3/uL (ref 0.0–0.7)
Eosinophils Relative: 2 % (ref 0–5)
Lymphocytes Relative: 40 % (ref 12–46)
Lymphs Abs: 2.2 10*3/uL (ref 0.7–4.0)
Monocytes Absolute: 0.4 10*3/uL (ref 0.1–1.0)
Monocytes Relative: 7 % (ref 3–12)
Neutro Abs: 2.7 10*3/uL (ref 1.7–7.7)
Neutrophils Relative %: 50 % (ref 43–77)

## 2010-05-30 LAB — URINALYSIS, ROUTINE W REFLEX MICROSCOPIC
Bilirubin Urine: NEGATIVE
Glucose, UA: >1000 mg/dL — AB
Hgb urine dipstick: NEGATIVE
Ketones, ur: 15 mg/dL — AB
Leukocytes, UA: NEGATIVE
Nitrite: NEGATIVE
Protein, ur: NEGATIVE mg/dL
Specific Gravity, Urine: 1.029 (ref 1.005–1.030)
Urobilinogen, UA: 0.2 mg/dL (ref 0.0–1.0)
pH: 5 (ref 5.0–8.0)

## 2010-05-30 LAB — CBC
HCT: 45.3 % (ref 36.0–46.0)
Hemoglobin: 15.4 g/dL — ABNORMAL HIGH (ref 12.0–15.0)
MCHC: 34 g/dL (ref 30.0–36.0)
MCV: 79.5 fL (ref 78.0–100.0)
Platelets: 278 10*3/uL (ref 150–400)
RBC: 5.7 MIL/uL — ABNORMAL HIGH (ref 3.87–5.11)
RDW: 13.7 % (ref 11.5–15.5)
WBC: 5.4 10*3/uL (ref 4.0–10.5)

## 2010-05-30 LAB — BASIC METABOLIC PANEL
BUN: 13 mg/dL (ref 6–23)
CO2: 26 mEq/L (ref 19–32)
Calcium: 10.2 mg/dL (ref 8.4–10.5)
Chloride: 100 mEq/L (ref 96–112)
Creatinine, Ser: 0.6 mg/dL (ref 0.4–1.2)
GFR calc Af Amer: >60 mL/min (ref >60)
GFR calc non Af Amer: >60 mL/min (ref >60)
Glucose, Bld: 321 mg/dL — ABNORMAL HIGH (ref 70–99)
Potassium: 4 mEq/L (ref 3.5–5.1)
Sodium: 138 mEq/L (ref 135–145)

## 2010-05-30 LAB — GLUCOSE, CAPILLARY: Glucose-Capillary: 254 mg/dL — ABNORMAL HIGH (ref 70–99)

## 2010-05-30 LAB — URINE MICROSCOPIC-ADD ON

## 2010-05-30 LAB — URIC ACID: Uric Acid, Serum: 2.9 mg/dL (ref 2.4–7.0)

## 2010-06-04 ENCOUNTER — Telehealth: Payer: Self-pay | Admitting: Internal Medicine

## 2010-06-04 DIAGNOSIS — E785 Hyperlipidemia, unspecified: Secondary | ICD-10-CM

## 2010-06-04 DIAGNOSIS — IMO0001 Reserved for inherently not codable concepts without codable children: Secondary | ICD-10-CM

## 2010-06-04 DIAGNOSIS — I10 Essential (primary) hypertension: Secondary | ICD-10-CM

## 2010-06-04 NOTE — Telephone Encounter (Signed)
Pt coming in for cpe appt on 06-22-10 at 2:30. Last labs were on 05-26-09. Please order labs.

## 2010-06-09 LAB — VITAMIN D 25 HYDROXY (VIT D DEFICIENCY, FRACTURES): Vit D, 25-Hydroxy: 15 ng/mL — ABNORMAL LOW (ref 30–89)

## 2010-06-09 LAB — GLUCOSE, CAPILLARY
Glucose-Capillary: 126 mg/dL — ABNORMAL HIGH (ref 70–99)
Glucose-Capillary: 150 mg/dL — ABNORMAL HIGH (ref 70–99)
Glucose-Capillary: 168 mg/dL — ABNORMAL HIGH (ref 70–99)
Glucose-Capillary: 199 mg/dL — ABNORMAL HIGH (ref 70–99)
Glucose-Capillary: 208 mg/dL — ABNORMAL HIGH (ref 70–99)
Glucose-Capillary: 225 mg/dL — ABNORMAL HIGH (ref 70–99)
Glucose-Capillary: 242 mg/dL — ABNORMAL HIGH (ref 70–99)
Glucose-Capillary: 263 mg/dL — ABNORMAL HIGH (ref 70–99)
Glucose-Capillary: 285 mg/dL — ABNORMAL HIGH (ref 70–99)
Glucose-Capillary: 311 mg/dL — ABNORMAL HIGH (ref 70–99)
Glucose-Capillary: 386 mg/dL — ABNORMAL HIGH (ref 70–99)

## 2010-06-09 LAB — ABO/RH: ABO/RH(D): O NEG

## 2010-06-09 LAB — URINALYSIS, ROUTINE W REFLEX MICROSCOPIC
Bilirubin Urine: NEGATIVE
Glucose, UA: NEGATIVE mg/dL
Hgb urine dipstick: NEGATIVE
Ketones, ur: NEGATIVE mg/dL
Nitrite: NEGATIVE
Protein, ur: NEGATIVE mg/dL
Specific Gravity, Urine: 1.02 (ref 1.005–1.030)
Urobilinogen, UA: 0.2 mg/dL (ref 0.0–1.0)
pH: 5.5 (ref 5.0–8.0)

## 2010-06-09 LAB — CBC
HCT: 35.2 % — ABNORMAL LOW (ref 36.0–46.0)
HCT: 42 % (ref 36.0–46.0)
Hemoglobin: 11.7 g/dL — ABNORMAL LOW (ref 12.0–15.0)
Hemoglobin: 14.2 g/dL (ref 12.0–15.0)
MCHC: 33.1 g/dL (ref 30.0–36.0)
MCHC: 33.8 g/dL (ref 30.0–36.0)
MCV: 78.4 fL (ref 78.0–100.0)
MCV: 80.8 fL (ref 78.0–100.0)
Platelets: 241 10*3/uL (ref 150–400)
Platelets: 265 10*3/uL (ref 150–400)
RBC: 4.36 MIL/uL (ref 3.87–5.11)
RBC: 5.36 MIL/uL — ABNORMAL HIGH (ref 3.87–5.11)
RDW: 13.8 % (ref 11.5–15.5)
RDW: 13.8 % (ref 11.5–15.5)
WBC: 5.7 10*3/uL (ref 4.0–10.5)
WBC: 7.4 10*3/uL (ref 4.0–10.5)

## 2010-06-09 LAB — PROTIME-INR
INR: 0.9 (ref 0.00–1.49)
Prothrombin Time: 12.1 seconds (ref 11.6–15.2)

## 2010-06-09 LAB — BASIC METABOLIC PANEL
BUN: 11 mg/dL (ref 6–23)
CO2: 26 mEq/L (ref 19–32)
Calcium: 8.6 mg/dL (ref 8.4–10.5)
Chloride: 100 mEq/L (ref 96–112)
Creatinine, Ser: 0.81 mg/dL (ref 0.4–1.2)
GFR calc Af Amer: >60 mL/min (ref >60)
GFR calc non Af Amer: >60 mL/min (ref >60)
Glucose, Bld: 257 mg/dL — ABNORMAL HIGH (ref 70–99)
Potassium: 4.1 mEq/L (ref 3.5–5.1)
Sodium: 133 mEq/L — ABNORMAL LOW (ref 135–145)

## 2010-06-09 LAB — DIFFERENTIAL
Basophils Absolute: 0 10*3/uL (ref 0.0–0.1)
Basophils Relative: 1 % (ref 0–1)
Eosinophils Absolute: 0.1 10*3/uL (ref 0.0–0.7)
Eosinophils Relative: 1 % (ref 0–5)
Lymphocytes Relative: 32 % (ref 12–46)
Lymphs Abs: 1.8 10*3/uL (ref 0.7–4.0)
Monocytes Absolute: 0.3 10*3/uL (ref 0.1–1.0)
Monocytes Relative: 6 % (ref 3–12)
Neutro Abs: 3.4 10*3/uL (ref 1.7–7.7)
Neutrophils Relative %: 61 % (ref 43–77)

## 2010-06-09 LAB — COMPREHENSIVE METABOLIC PANEL
ALT: 54 U/L — ABNORMAL HIGH (ref 0–35)
AST: 41 U/L — ABNORMAL HIGH (ref 0–37)
Albumin: 4.1 g/dL (ref 3.5–5.2)
Alkaline Phosphatase: 101 U/L (ref 39–117)
BUN: 11 mg/dL (ref 6–23)
CO2: 28 mEq/L (ref 19–32)
Calcium: 9.4 mg/dL (ref 8.4–10.5)
Chloride: 105 mEq/L (ref 96–112)
Creatinine, Ser: 0.7 mg/dL (ref 0.4–1.2)
GFR calc Af Amer: >60 mL/min (ref >60)
GFR calc non Af Amer: >60 mL/min (ref >60)
Glucose, Bld: 159 mg/dL — ABNORMAL HIGH (ref 70–99)
Potassium: 4.2 mEq/L (ref 3.5–5.1)
Sodium: 140 mEq/L (ref 135–145)
Total Bilirubin: 0.7 mg/dL (ref 0.3–1.2)
Total Protein: 7.3 g/dL (ref 6.0–8.3)

## 2010-06-09 LAB — TYPE AND SCREEN
ABO/RH(D): O NEG
Antibody Screen: NEGATIVE

## 2010-06-09 LAB — APTT: aPTT: 26 seconds (ref 24–37)

## 2010-06-09 NOTE — Telephone Encounter (Signed)
Bmet, a1c, microalb/cr ratio - 250.02 FLP, LFTs, TSH - 272.4

## 2010-06-10 NOTE — Telephone Encounter (Signed)
Call placed to patient at [phone], no answer. A detailed voice message was left informing patient of fasting blood work

## 2010-06-16 LAB — HEPATIC FUNCTION PANEL
ALT: 43 U/L — ABNORMAL HIGH (ref 0–35)
AST: 21 U/L (ref 0–37)
Albumin: 4.8 g/dL (ref 3.5–5.2)
Alkaline Phosphatase: 93 U/L (ref 39–117)
Bilirubin, Direct: 0.1 mg/dL (ref 0.0–0.3)
Indirect Bilirubin: 0.3 mg/dL (ref 0.0–0.9)
Total Bilirubin: 0.4 mg/dL (ref 0.3–1.2)
Total Protein: 7.6 g/dL (ref 6.0–8.3)

## 2010-06-16 LAB — MICROALBUMIN / CREATININE URINE RATIO
Creatinine, Urine: 197.7 mg/dL
Microalb Creat Ratio: 10.2 mg/g (ref 0.0–30.0)
Microalb, Ur: 2.02 mg/dL — ABNORMAL HIGH (ref 0.00–1.89)

## 2010-06-16 LAB — LIPID PANEL
Cholesterol: 235 mg/dL — ABNORMAL HIGH (ref 0–200)
HDL: 33 mg/dL — ABNORMAL LOW (ref >39)
LDL Cholesterol: 162 mg/dL — ABNORMAL HIGH (ref 0–99)
Total CHOL/HDL Ratio: 7.1 Ratio
Triglycerides: 198 mg/dL — ABNORMAL HIGH (ref <150)
VLDL: 40 mg/dL (ref 0–40)

## 2010-06-16 LAB — HEMOGLOBIN A1C
Hgb A1c MFr Bld: 11.5 % — ABNORMAL HIGH (ref <5.7)
Mean Plasma Glucose: 283 mg/dL — ABNORMAL HIGH (ref <117)

## 2010-06-16 LAB — BASIC METABOLIC PANEL
BUN: 19 mg/dL (ref 6–23)
CO2: 23 mEq/L (ref 19–32)
Calcium: 9.6 mg/dL (ref 8.4–10.5)
Chloride: 101 mEq/L (ref 96–112)
Creat: 0.67 mg/dL (ref 0.40–1.20)
Glucose, Bld: 140 mg/dL — ABNORMAL HIGH (ref 70–99)
Potassium: 4.1 mEq/L (ref 3.5–5.3)
Sodium: 138 mEq/L (ref 135–145)

## 2010-06-16 LAB — TSH: TSH: 2.36 u[IU]/mL (ref 0.350–4.500)

## 2010-06-18 ENCOUNTER — Encounter: Payer: Self-pay | Admitting: Internal Medicine

## 2010-06-22 ENCOUNTER — Telehealth: Payer: Self-pay | Admitting: Internal Medicine

## 2010-06-22 ENCOUNTER — Ambulatory Visit (INDEPENDENT_AMBULATORY_CARE_PROVIDER_SITE_OTHER): Payer: 59 | Admitting: Internal Medicine

## 2010-06-22 ENCOUNTER — Encounter: Payer: Self-pay | Admitting: Internal Medicine

## 2010-06-22 VITALS — BP 110/72 | HR 80 | Temp 98.3°F | Resp 18 | Ht 67.0 in | Wt 219.0 lb

## 2010-06-22 DIAGNOSIS — I10 Essential (primary) hypertension: Secondary | ICD-10-CM

## 2010-06-22 DIAGNOSIS — E785 Hyperlipidemia, unspecified: Secondary | ICD-10-CM

## 2010-06-22 DIAGNOSIS — IMO0001 Reserved for inherently not codable concepts without codable children: Secondary | ICD-10-CM

## 2010-06-22 MED ORDER — INSULIN PEN NEEDLE 31G X 8 MM MISC: Status: DC

## 2010-06-22 MED ORDER — LISINOPRIL 20 MG PO TABS: 20.0000 mg | ORAL_TABLET | Freq: Every day | ORAL | Status: DC

## 2010-06-22 MED ORDER — PRAVASTATIN SODIUM 40 MG PO TABS: 40.0000 mg | ORAL_TABLET | Freq: Every day | ORAL | Status: DC

## 2010-06-22 MED ORDER — INSULIN ASPART 100 UNIT/ML ~~LOC~~ SOLN: 15.0000 [IU] | Freq: Three times a day (TID) | SUBCUTANEOUS | Status: DC

## 2010-06-22 MED ORDER — METFORMIN HCL 500 MG PO TABS: 500.0000 mg | ORAL_TABLET | Freq: Two times a day (BID) | ORAL | Status: DC

## 2010-06-22 MED ORDER — INSULIN GLARGINE 100 UNIT/ML ~~LOC~~ SOLN: 50.0000 [IU] | Freq: Every day | SUBCUTANEOUS | Status: DC

## 2010-06-22 MED ORDER — AMLODIPINE BESYLATE 2.5 MG PO TABS: 2.5000 mg | ORAL_TABLET | Freq: Every day | ORAL | Status: DC

## 2010-06-22 NOTE — Patient Instructions (Signed)
Please check your blood sugar 3 x daily (AM fasting, before dinner and at bedtime) Bring your glucometer or your blood sugar log to your next office appointment

## 2010-06-22 NOTE — Progress Notes (Signed)
Subjective:    Patient ID: [name] [name], female    DOB: 09/20/1965, 45 y.o.   MRN: [id number]  Diabetes She presents for her follow-up diabetic visit. She has type 2 diabetes mellitus. Her disease course has been worsening. There are no hypoglycemic associated symptoms. Associated symptoms include blurred vision and polydipsia. Symptoms are worsening. Risk factors for coronary artery disease include dyslipidemia, diabetes mellitus and hypertension. Current diabetic treatment includes insulin injections. She is compliant with treatment none of the time. She is following a generally unhealthy diet. When asked about meal planning, she reported none. Blood glucose monitoring compliance is poor.      Review of Systems  Eyes: Positive for blurred vision.  Hematological: Positive for polydipsia.    Past Medical History  Diagnosis Date  . Hyperlipidemia   . Hypertension   . Diabetes mellitus     type 2 uncontrolled  . Depression   . Inflammatory bowel disease   . Head ache     daily  . History of DVT (deep vein thrombosis) 2000  . Chronic back pain     History   Social History  . Marital Status: Married    Spouse Name: N/A    Number of Children: 2  . Years of Education: N/A   Occupational History  . [employer]    Social History Main Topics  . Smoking status: Never Smoker   . Smokeless tobacco: Not on file  . Alcohol Use: No  . Drug Use: Not on file  . Sexually Active: Not on file   Other Topics Concern  . Not on file   Social History Narrative  . No narrative on file    Past Surgical History  Procedure Date  . Spine surgery 03/2008    Dr Alveda Reasons  . Oophorectomy 2008  . Abdominal hysterectomy 1990  . Pelvic laparoscopy     for endometriosis    Family History  Problem Relation Age of Onset  . Alcohol abuse Other   . Arthritis Other   . Cancer Other     breast, ovarian,prostate  . Heart disease Other     CAd  . Diabetes Other   . Hyperlipidemia Other     . Hypertension Other   . Kidney disease Other   . Stroke Other     No Known Allergies  Current Outpatient Prescriptions on File Prior to Visit  Medication Sig Dispense Refill  . amLODipine (NORVASC) 2.5 MG tablet Take 2.5 mg by mouth daily.        Marland Kitchen aspirin 81 MG tablet Take 81 mg by mouth daily.        . insulin aspart (NOVOLOG FLEXPEN) 100 UNIT/ML injection Inject 15-30 Units into the skin 3 (three) times daily before meals.        . insulin glargine (LANTUS SOLOSTAR) 100 UNIT/ML injection Inject 60-80 Units into the skin daily.        . Insulin Pen Needle (BD ULTRA-FINE PEN NEEDLES) 29G X 12.7MM MISC Use for insulin injections 4 times daily as directed.       Marland Kitchen lisinopril (PRINIVIL,ZESTRIL) 20 MG tablet Take 20 mg by mouth daily.        . metFORMIN (GLUCOPHAGE) 500 MG tablet Take 500 mg by mouth 2 (two) times daily with a meal.        . Multiple Vitamin (MULTIVITAMIN) tablet Take 1 tablet by mouth daily.        Marland Kitchen gabapentin (NEURONTIN) 300 MG capsule Take 300  mg by mouth at bedtime and may repeat dose one time if needed.        . rosuvastatin (CRESTOR) 20 MG tablet Take 20 mg by mouth daily.          BP 110/72  Pulse 80  Temp(Src) 98.3 F (36.8 C) (Oral)  Resp 18  Ht 5\' 7"  (1.702 m)  Wt 219 lb (99.338 kg)  BMI 34.30 kg/m2  SpO2 98%       Objective:   Physical Exam  Constitutional: She is oriented to person, place, and time. She appears well-developed and well-nourished.  HENT:  Head: Normocephalic and atraumatic.  Cardiovascular: Normal rate, regular rhythm and normal heart sounds.   Pulmonary/Chest: Effort normal and breath sounds normal. She has no wheezes. She has no rales.  Abdominal: Bowel sounds are normal.  Lymphadenopathy:    She has no cervical adenopathy.  Neurological: She is alert and oriented to person, place, and time.  Skin: Skin is warm and dry.  Psychiatric: She has a normal mood and affect. Her behavior is normal.    Assessment & Plan:

## 2010-06-22 NOTE — Telephone Encounter (Signed)
Pt states that she uses one touch ultra smart to check her sugar. Pt states that she has 11 refills until 10/11/10

## 2010-06-23 NOTE — Telephone Encounter (Signed)
noted 

## 2010-07-07 LAB — HM DIABETES EYE EXAM

## 2010-07-07 NOTE — Op Note (Signed)
NAME:  [name] [name], [name] [name]                   ACCOUNT NO.:  [id number]   MEDICAL RECORD NO.:  [id number]          PATIENT TYPE:  INP   LOCATION:  5037                         FACILITY:  [facility]   PHYSICIAN:  Nelda Severe, MD      DATE OF BIRTH:  14-Oct-1965   DATE OF PROCEDURE:  03/26/2008  DATE OF DISCHARGE:                               OPERATIVE REPORT   SURGEON:  Nelda Severe, MD   ASSISTANT:  Lianne Cure, PA-C   PREOPERATIVE DIAGNOSIS:  L4-5 disk herniation left and central.   POSTOPERATIVE DIAGNOSIS:  L4-5 disk herniation left and central.   OPERATIVE PROCEDURE:  Left L4-5 laminotomy and disk excision.   OPERATIVE FINDINGS:  Extruded fragments left, central and proximal to  the L4-5 disk space.   OPERATIVE NOTE:  The patient was placed under general endotracheal  anesthesia.  She was positioned in the kneeling position on the Pella  frame.  Care was taken to position the upper extremities so as to avoid  hyperflexion and abduction of the shoulders.  The upper extremities were  padded with foam from axilla to hands.  She received prophylactic  intravenous antibiotics.   Once she was adequately positioned, the lumbar area was prepped with  DuraPrep and draped in a rectangular fashion.  The drapes were secured  with Ioban.   A vertical incision slightly to the left of midline was made over what  was perceived through the L4-5 interspace.  The subcutaneous tissue was  injected with a mixture of 0.25% Marcaine and 1% lidocaine with  epinephrine.  The skin was incised in the subcutaneous tissue,  moderately thick layer, incised down to the spinous processes.  Paraspinal muscles were mobilized laterally to the left side.  A cross-  table lateral radiograph was taken with a Kocher on what proved to be  the trailing edge of the L4 lamina.  A Taylor retractor was placed  lateral to the L4-5 apophyseal joint.  High speed bur was used to make a  laminotomy proximally and perform  approximately 25% medial facetectomy  at L4-5 left.  The ligamentum flavum was detached using curettes from  the upper lamina of L5 on the left.  The ligamentum flavum was then  removed using Kerrison rongeurs.  The lateral recess was further  decompressed with Kerrison rongeurs.  The dura was mobilized medially  off the L4-5 disk.  The disk herniation could be palpated with a ball-  tipped nerve hook.  The dura and origins of the L5 nerve root were  retracted medially with a Love nerve root retractor.  The disk was  fenestrated and curetted with Epstein curettes.  Fragments of disk were  delivered via the pituitary.  I swept the floor of the canal and could  feel residual disk herniation medially and proximally.  This was teased  out using a nerve hook and pituitary rongeurs.  Decompressive efforts  were ceased when I was not able to palpate any more disk fragments.  The  disk space itself was irrigated with saline.   Epidural bleeding was controlled using  bipolar coagulation and by  placing a Gelfoam, cottonoid patty on the floor of the canal for few  minutes and then removing both.  This resulted there being virtually no  oozing from epidural veins.  I also did apply bone wax to the bony  margins of the laminotomy.  An eighth inch Hemovac drain was placed  subfascially and brought out through the skin to the right side.  It was  secured with 2-0 nylon suture in basket-weave fashion.  The  thoracolumbar fascia was closed with three interrupted figure-of-eight  #1 Vicryl sutures.  The subcutaneous tissue was closed using interrupted  and inverted 2-0 undyed Vicryl sutures.  The skin was closed using  subcuticular 3-0 undyed Vicryl in continuous fashion.  The skin edges  were reinforced with Steri-Strips.  Antibiotic ointment dressing applied  and secured with OpSite.  The blood loss estimated at 50 mL.  There were  no intraoperative complications.  Sponge and needle counts were  correct.   The patient was taken off the table and transferred to the recovery room  where she was able to move both feet and ankles without difficulty.      Nelda Severe, MD  Electronically Signed     MT/MEDQ  D:  03/26/2008  T:  03/27/2008  Job:  [id number]

## 2010-07-07 NOTE — Op Note (Signed)
NAME:  [name], [name]                   ACCOUNT NO.:  [id number]   MEDICAL RECORD NO.:  [id number]          PATIENT TYPE:  AMB   LOCATION:  SDC                           FACILITY:  [facility]   PHYSICIAN:  Freddy Finner, M.D.   DATE OF BIRTH:  1965-08-30   DATE OF PROCEDURE:  08/02/2006  DATE OF DISCHARGE:                               OPERATIVE REPORT   PREOPERATIVE DIAGNOSES:  1. Chronic pelvic pain.  2. Known previous pelvic endometriosis.  3. Complex right adnexal mass with calcifications.   POSTOPERATIVE DIAGNOSES:  1. Fibroma of right ovary.  2. Follicular-appearing cyst of right ovary.  3. Multiple follicular cysts of left ovary.  4. No residual evidence of pelvic endometriosis.   OPERATIVE PROCEDURE:  Laparoscopy, bilateral salpingo-oophorectomy.   SURGEON:  Freddy Finner, M.D.   ANESTHESIA:  General.   ESTIMATED INTRAOPERATIVE BLOOD LOSS:  Less than 50 mL.   INTRAOPERATIVE COMPLICATIONS:  None.   The patient is a 45 year old with a long history of known pelvic  endometriosis and pelvic pain.  Her endocrinologist has speculated that  her chronic pain may in fact be contributing to her lack of diabetic  control.  For this reason, as well as for the history of endometriosis  and pain, she has requested definitive surgery.  She is admitted at this  time for a laparoscopic-assisted bilateral salpingo-oophorectomy.  In  reviewing old records, a fibroma was noted in 2005 at laparoscopy; on  biopsy it was benign.   She was admitted on the morning of surgery.  She was placed in PAS hose.  She was immediately started on Glucommander since her sugars were in the  mid 200s.  She was brought to the operating room, placed under adequate  general anesthesia, placed in the dorsal lithotomy position.  Allen  stirrup system was used.  Betadine prep of abdomen was carried out.  The  bladder was evacuated with a Foley catheter under sterile conditions.  A  sponge forceps was placed in the  vagina with a sponge at the tip for  potential use for traction or exposure during the procedure.  Two  incisions were made - one just at the umbilicus and one just above the  symphysis.  An 11-mm nonbladed disposable trocar was introduced through  the umbilicus.  A 12-mm bladed disposable trocar was introduced just  above the umbilicus under direct visualization after placement of the  umbilical incision and inspection revealed no injury and adequate  placement within the peritoneal cavity.  Scanning inspection of the  upper abdomen revealed no apparent abnormalities.  The appendix was not  visualized.  There was no evidence of active pelvic peritoneal disease.  The right ovary was enlarged and regular with an approximately 2- to 3-  cm fibrous white lesion consistent with a fibroma.  The left ovary had  multiple small cystic structures but was otherwise normal.  Left  fallopian tubes were normal.  The Gyrus tripolar device was then used to  progressively develop, seal and divide the infundibulopelvic ligament  and remaining ligaments attached to the  ovaries on each side.  The left  ovary was small and adequately retrieved using a large grasping forceps,  typically referred to as the claw, and delivering it through the lower  incision intact.  The right ovary was morcellated.  A portion of the  ovary was easily retrieved, another portion was not.  The fibroma was  difficult to morcellate and ultimately the fascial incision was extended  to about 2-3 cm.  The Endobag was used and the fibroma was delivered  through the lower incision.  This required numerous attempts.  Irrigation was then carried out.  There was a small bleeding in the  extended incision just above the symphysis and this was controlled with  the bipolar forceps.  Irrigation was carried out using the Nezhat system  and hemostasis was confirmed to be complete.  At this point, all gas was  allowed to escape from the abdomen,  the instruments were removed.  The  fascia on the lower incision was closed with a figure-of-eight of 0  Vicryl on an ER6 needle.  The subcutaneous tissue was approximated with  interrupted Vicryl pop-off sutures.  The skin was closed with a  subcuticular suture of 3-0 Dexon and Steri-Strips were applied to this  incision.  The skin incision was extended for approximately 3 cm to  allow delivery of the fibroma.  The umbilical incision was closed with  interrupted subcuticular sutures of 3-0 Dexon.  Sterile dressing was  applied to this.  Plain Marcaine 0.5% was injected into the incision  sites for postoperative analgesia.  At the suggestion of anesthesia and  after the more extensive nature of the procedure, it was elected to  admit her as an observation patient overnight for sugar management and  observation.      Freddy Finner, M.D.  Electronically Signed     WRN/MEDQ  D:  08/02/2006  T:  08/02/2006  Job:  [id number]

## 2010-07-10 ENCOUNTER — Encounter: Payer: Self-pay | Admitting: Internal Medicine

## 2010-07-10 NOTE — H&P (Signed)
NAME:  [name] [name], [name] [name]                   ACCOUNT NO.:  [id number]   MEDICAL RECORD NO.:  [id number]          PATIENT TYPE:  INP   LOCATION:  1824                         FACILITY:  [facility]   PHYSICIAN:  Sherin Quarry, MD      DATE OF BIRTH:  1965/09/09   DATE OF ADMISSION:  06/14/2005  DATE OF DISCHARGE:                                HISTORY & PHYSICAL   [name] [name] is a 45 year old lady who states that over the last several days  she has been experiencing recurrent episodes of chest tightness. These have  occurred at night, particular last night, and also during the day with  activity. Today, she noted that the pain was associated with left arm  pressure and discomfort. [facility] where in  light of her multiple risk factors a decision was made to send her to the  ER. In the ER, on arrival, her blood pressure was 141/100. She was  administered nitroglycerin and states that the nitroglycerin seemed to ease  her arm discomfort. Subsequent evaluation has included initial point of care  enzymes which were negative. CBC which is a within normal limits and a CMET  which is notable chiefly for a blood sugar of 197. In light of her multiple  risk factors, it was felt prudent to admit the patient the hospital for  further evaluation. Of note is that this patient has had several evaluations  by cardiology in the past. In 1999, she had a stress test done by [facility]. In 2002, she apparently had a stress test done by [facility] and was told that this study was normal. She has also had a more  recent study done by [facility] possibly about two years ago. This may  have been an adenosine Cardiolite by her description and this study was  reportedly negative as well. On each occasion, she was evaluated because of  complaints of chest pain. Of great significance is that the patient has a 5-  year history of diabetes which sounds like it is probably not very  well  regulated. She also has a history of hypertension and hyperlipidemia and has  not been taking her cholesterol medication consistently. Also of  significance is that her mother has a history of CABG x6 at age 37 and her  father has a history of atrial fibrillation and congestive heart failure. In  light of all of these considerations, the patient will be admitted for  observation and evaluation.   PAST MEDICAL HISTORY:  Current medications consist of lisinopril 10  milligrams daily, Vytorin 10/20 1 daily, metformin 500 milligrams t.i.d.,  Actos 45 milligrams daily, glipizide 10 milligrams b.i.d. She is allergic to  SULFA drugs.   OPERATION:  She has had a previous hysterectomy. She has also had what  sounds like a laparoscopic procedure with curettage of an ovary as well as  excision of an ovarian fibroid. She states that she has also had  colonoscopies in the past because of rectal bleeding and has undergone a  polypectomy.  MEDICAL ILLNESSES:  1.  Diabetes:  The patient has a 5-year history of diabetes and states that      recently her blood sugar control has not been very good. Last night, she      says her blood sugar was 320. She states that she is taking her      medicines consistently.  2.  Hypertension:  She states she was first diagnosed with hypertension      about 4-5 years ago. She has not seen Dr. Lynelle Doctor for about 8 months and      so she is not really sure how her blood pressure control has been      recently.  3.  Hyperlipidemia:  The patient states that she has stopped taking her      cholesterol medicine because it seemed to make her legs hurt.  Recently,      Dr. Lynelle Doctor has placed her on Vytorin 10/20 which she is trying to see if      this would work better.  As mentioned previously, the patient's family      history is very significant for cardiac disease, also both her mother      and father has diabetes.   SOCIAL HISTORY:  She does not smoke. She does not  abuse alcohol or drugs.  She is accompanied by her husband.   REVIEW OF SYSTEMS:  HEAD:  She reports chronic dull headache and dizziness.  EAR, NOSE AND THROAT:  Denies earache, sinus pain or sore throat. CHEST:  Denies coughing wheezing or chest congestion. CARDIOVASCULAR:  See above.  GI: Denies nausea, vomiting, abdominal pain, change in bowel habits, melena  or hematochezia. GU: Denies dysuria. NEURO:  There is no history of seizure  or stroke. ENDOCRINE:  See above.   PHYSICAL EXAM:  She is a pleasant cooperative lady. HEENT exam is within  normal limits. The chest is clear. The back reveals no CVA or point  tenderness. Cardiovascular exam reveals normal S1-S2 without rubs, murmurs  or gallops. The abdomen is benign. There are normal bowel sounds. No masses  or tenderness. No guarding or rebound. Neurologic testing is within normal  limits. Examination of the extremities reveals no cyanosis or edema.   IMPRESSION:  1.  Recurrent chest pain atypical in nature:  Need to rule out myocardial      infarction in light of multiple risk factors.  2.  Diabetes, possibly not well regulated.  3.  Hyperlipidemia.  4.  Strong family history of heart disease.  5.  Hypertension.  6.  History of colon polyps.   PLAN:  Will be to admit the patient for 24-hour observation. Cardiology  consult will be obtained and consideration given to possibly repeating  stress testing.           ______________________________  Sherin Quarry, MD     SY/MEDQ  D:  06/14/2005  T:  06/14/2005  Job:  [id number]   cc:   Lavonda Jumbo, M.D.  Fax: [phone]   Meade Maw, M.D.  Fax: [phone]

## 2010-07-10 NOTE — Discharge Summary (Signed)
NAME:  [name] [name], [name] [name]                   ACCOUNT NO.:  [id number]   MEDICAL RECORD NO.:  [id number]          PATIENT TYPE:  INP   LOCATION:  5037                         FACILITY:  [facility]   PHYSICIAN:  Nelda Severe, MD      DATE OF BIRTH:  16-Apr-1965   DATE OF ADMISSION:  03/26/2008  DATE OF DISCHARGE:  03/28/2008                               DISCHARGE SUMMARY   This is a 45 year old female admitted to [facility] on March 26, 2008 with diagnosis of preoperative L4-5 herniated disk.  Operative  plan was carried out disk excision L4-5 by Dr. Charlsie Quest assistant  Lianne Cure, PA-C.  Postoperatively, she was stable.  She was  admitted to [facility] postop day #1.  She did have pain with  sitting upright.  She has no shortness of breath, no chest pain.  She  had a drainage output of 25 mL in the past 24 hours.  Her glucose was  high at 257.  She was on insulin.  She was on a glucose sliding scale.  Her vitamin D level was 415 which was low.  She was started on 50,000  units of vitamin D.  Calves were soft and nontender to palpation.  We  discontinued the Foley.  We had a Physical Therapy consultation for  evaluation and mobility.  Postop day #2, the patient was feeling much  better.  She does go cold.  She states when her blood glucose is not  properly controlled, she has this sensation.  Her leg pain is better.  She does have back pain just at the end of the incision.  She is moving  about her room independently.  She is neurovascularly and motor intact.  Dressing is clean and dry.  Calves are soft.  She was voiding  independently and wishes to go home.  The drain was discontinued.  Dressing was changed.  Foley was discontinued.  She was Heplocked until  leaving the hospital.  PT is deeming her safe to leave the hospital.   DISCHARGE DIAGNOSIS:  Herniated disk L4-5 lumbar with sciatic pain.   DISPOSITION:  Stable.   DIET:  Regular.   Follow up in 3-4 weeks  with Dr. Nelda Severe.  She will be sent home on  hydrocodone 10 one to two q. 4 p.r.n. for pain, Robaxin 500 mg one q.6-8  p.r.n for muscle spasms, walk for exercise, no bending, stooping,  lifting and she will get a three-in-one for home use and a rolling  walker.      Lianne Cure, P.A.      Nelda Severe, MD  Electronically Signed    MC/MEDQ  D:  05/01/2008  T:  05/02/2008  Job:  [id number]

## 2010-07-10 NOTE — Discharge Summary (Signed)
[facility]  Patient:    [name] [name], [name] [name] Visit Number: [id number] MRN: [id number]          Service Type: MED Location: [phone] Attending Physician:  Hinda Glatter Dictated by:   Dianna Limbo, M.D. Admit Date:  06/15/2001 Discharge Date: 06/16/2001                             Discharge Summary  DISCHARGE DIAGNOSES: 1. Chest pain. 2. Diabetes mellitus. 3. Hypertension.  PROCEDURES:  Exercise stress EKG test.  DISCHARGE MEDICATIONS: 1. Lisinopril 10 mg q.d. 2. Glucophage 500 mg b.i.d. 3. Aspirin 81 mg q.d. 4. Vioxx as needed. 5. Ultracet as needed.  HISTORY OF PRESENT ILLNESS:  A 45 year old white female with past medical history significant for diabetes and hypertension as well as a family history of coronary artery disease was admitted for further evaluation of chest pain. The patient states that over the several days prior to her admission she had several episodes of chest pain with left arm tingling.  A couple of these episodes occurred while she was exerting herself and were relieved with rest. She was seen in the clinic several days ago with a normal EKG, however, had repeat chest pain and presented back to the clinic and therefore was admitted for further evaluation.  The following developments took place: 1. CARDIAC: The patient was ruled out for myocardial infarction with serial    cardiac enzymes.  she underwent a exercise stress EKG test which showed no    evidence of ischemia.  She remained chest pain free throughout the rest of    her hospital stay and was discharged.  DISPOSITION: The patient is discharged home in stable condition.  She is on no activity restrictions and is on a diabetic diet.  FOLLOWUP:  She is to follow up with her primary physician, Dr. Lester Kinsman, in 5-7 days after discharge for further evaluation. Dictated by:   Dianna Limbo, M.D. Attending Physician:  Hinda Glatter DD:  07/25/01 TD:   07/26/01 Job: [id number]

## 2010-07-10 NOTE — Discharge Summary (Signed)
NAME:  [name] [name], [name] [name]                   ACCOUNT NO.:  [id number]   MEDICAL RECORD NO.:  [id number]          PATIENT TYPE:  OIB   LOCATION:  9309                          FACILITY:  [facility]   PHYSICIAN:  Freddy Finner, M.D.   DATE OF BIRTH:  12/02/1965   DATE OF ADMISSION:  08/02/2006  DATE OF DISCHARGE:  08/03/2006                               DISCHARGE SUMMARY   ADMITTING DIAGNOSIS:  Persistent right ovarian mass.   DISCHARGE DIAGNOSES:  1. 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  2. Corpus luteum cyst of the right ovary.  3. Benign left ovary.   OPERATIVE PROCEDURE:  Laparoscopic bilateral salpingo-oophorectomy.   INTRAOPERATIVE COMPLICATIONS:  None.   POSTOPERATIVE COMPLICATIONS:  None.   REASON FOR ADMISSION:  1. Management of elevated blood glucose.  2. Minor postoperative event versus small hematoma of the lower      abdominal incision which is stable and satisfactory at the time of      discharge.   Patient is a 45 year old.  The details of her present illness are  recorded in the operative note or in the handwritten summary.  She was  admitted as an outpatient for outpatient procedure.  Her glucose was  elevated in the 250 range on admission and at the suggestion of  anesthesia it was elected to admit her postoperatively for management of  her diabetes.  By the afternoon of the first postoperative day she was  tolerating a regular diet, her sugars were well under control with most  in the low 100 range.  By the morning of the first postoperative day she  was still having adequate glucose control with her standard doses of  medication.  She was managed perioperatively with a glucomander  protocol.  It was elected to discharge her home with Percocet for pain.  She is to resume all of her preoperative medications.  She is to have  limited heavy physical work but otherwise normal activity.  She is to  follow up in my office in approximately 1  week.      Freddy Finner, M.D.  Electronically Signed     WRN/MEDQ  D:  08/04/2006  T:  08/04/2006  Job:  [id number]

## 2010-07-10 NOTE — H&P (Signed)
[place]  Patient:    [name] [name], [name] [name] Visit Number: [id number] MRN: [id number]          Service Type: MED Location: [phone] Attending Physician:  Hinda Glatter Dictated by:   Armstead Peaks, M.D. Admit Date:  06/15/2001 Discharge Date: 06/16/2001                           History and Physical  DATE OF BIRTH:  [birth year]  CHIEF COMPLAINT:  Chest pain.  HISTORY OF PRESENT ILLNESS:  A 45 year old white female with past medical history significant for diabetes mellitus, hypertension, and family history of coronary artery disease is admitted for further evaluation of chest pain and left arm tingling.  Patient states that in the past she has had a history of sharp chest pains as well as left Pritts tingling but has worsened recently, specifically over the last three to four days.  She was in her usual state of health until Monday - approximately four days ago - when that night she developed chest pain described as being in the center of her chest, and left arm tingling.  She took an aspirin and the pain went away in approximately 30 to 40 minutes and she believes that the pain occurred at rest.  She was a little anxious that night.  The following morning she had a recurrent chest pain episode while she was "rushing around" in the house and she had left arm tingling as well as sharp chest pains that eventually eased off when she sat down and rested.  Yesterday evening while patient was ambulating, bringing trashcans around the house, she had a repeat episode of chest discomfort as well as tingling in the left arm that was relieved with rest.  She was seen in the clinic on June 13, 2001 for further evaluation and had a normal EKG with plans to set up the patient for a cardiac stress test.  Patient in the clinic today was found to be chest pain free and EKG showed normal sinus rhythm that was relatively unchanged, although she did have an increased  T wave inversion in lead III, but only in one lead.  There does appear to be an anxiety component, but based on her multiple risk factors, she is admitted for further evaluation and treatment.  PAST MEDICAL HISTORY: 1. Diabetes mellitus. 2. Hypertension.  CURRENT MEDICATIONS: 1. HCTZ 25 mg p.o. q.d. 2. Ultracet 25 mg as needed. 3. Vioxx as needed.  ALLERGIES:  No known drug allergies.  SURGICAL HISTORY:  Noncontributory.  FAMILY HISTORY:  Mother had a myocardial infarction prior to the age of 26 as well as a coronary artery bypass surgery at age 68.  The patient also had an aunt that was deceased secondary to myocardial infarction at age 56.  SOCIAL HISTORY:  Patient is married with two children.  She does not smoke, drink alcohol, or use illicit drugs.  REVIEW OF SYSTEMS:  Pertinent for occasional headaches and what is noted in the history of present illness.  Otherwise, all other systems are negative to include no chronic fevers, chills, night sweats, cough, shortness of breath, abdominal pain, nausea, vomiting, diarrhea, bright red blood per rectum, melena, hematochezia, hematuria, generalized weakness, easy bruising, or depression.  PHYSICAL EXAMINATION:  VITAL SIGNS:  Blood pressure 136/96, pulse 68, respirations 12, weight 214.  GENERAL APPEARANCE:  Well-developed, well-nourished young female in no acute distress.  Alert  and oriented, pleasant in demeanor.  HEENT:  Pupils equal and responsive, reactive to light.  Extraocular muscles are intact.  Nonicteric sclerae.  Tympanic membranes clear.  Nares clear.  The oral cavity and oropharynx clear, no masses or lesions noted.  NECK:  Supple.  No carotid bruits, no adenopathy, no increased jugular venous distention, no thyromegaly.  CARDIOVASCULAR:  Regular rate and rhythm, S1, S2, without murmur, rub, or gallop appreciated.  LUNGS:  Clear to auscultation bilaterally.  ABDOMEN:  Normal active bowel sounds.  Soft,  nontender, nondistended.  No hepatosplenomegaly, no palpable masses, no bruits.  EXTREMITIES:  No clubbing, cyanosis, or edema; 2+ peripheral pulses.  NEUROLOGIC:  Grossly intact.  LABORATORY DATA/STUDIES:  EKG dated June 15, 2001 showed normal sinus rhythm with nonspecific ST-T wave changes with T wave inversion in lead III that was different from prior EKG dated June 13, 2001.  IMPRESSION:  A 45 year old female with history of diabetes, hypertension, and family history of coronary artery disease is admitted with several day history of chest pain and left arm tingling with an exertional as well as anxiety component.  PLAN: 1. Patient will be admitted to a telemetry bed. 2. Cardiovascular:  The patient will be ruled out for myocardial infarction    with serial cardiac enzymes q.8h. x3.  She will be placed on aspirin q.d.    as well as 2 liters of O2 nasal cannula and nitroglycerin as needed.  She    will be anticoagulated with Lovenox 1 mg/kg subcu q.12h.  She will be    placed on Lopressor 12.5 mg p.o. q.6h.  She will have a repeat EKG.  She    will also have a fasting lipid profile checked for further risk    stratification, as well as a hemoglobin A1c.  Should she rule out, she    will benefit from a cardiac stress test, invasive or noninvasive. Dictated by:   Armstead Peaks, M.D. Attending Physician:  Hinda Glatter DD:  06/15/01 TD:  06/15/01 Job: [id number]

## 2010-07-10 NOTE — Discharge Summary (Signed)
NAME:  Bondarenko, Thresa R                             ACCOUNT NO.:  [id number]   MEDICAL RECORD NO.:  [id number]                   PATIENT TYPE:  INP   LOCATION:  0257                                 FACILITY:  [facility]   PHYSICIAN:  Iva Boop, M.D. [facility]           DATE OF BIRTH:  [birth year]   DATE OF ADMISSION:  01/12/2003  DATE OF DISCHARGE:  01/13/2003                                 DISCHARGE SUMMARY   DISCHARGE DIAGNOSES:  1. Gastrointestinal bleeding, from rectal polypectomy site, self-limited.  2. Diabetes mellitus type 2.  3. Hypertension.  4. Gastroesophageal reflux disease (clinical diagnosis).  5. Endometriosis.  6. Chronic constipation/irritable bowel syndrome.   HISTORY AND PHYSICAL:  See my dictated note from November 20th.   HOSPITAL COURSE:  The patient was admitted and monitored and did not  experience any further significant bleeding.  She did describe some problems  with intermittent heartburn and indigestion symptoms.  She was given some  milk of magnesia that produced some soft bowel movements and had no further  bleeding as noted.  Her hemoglobin was 13.1 on admission and 12.1 at the  time of discharge.   DISCHARGE MEDICATIONS:  1. Lisinopril 10 mg daily.  2. Actos 15 mg daily.  3. Glucophage XR 1000 mg each evening.  4. Milk of magnesia up to one time a day to promote bowel movements.  5. Protonix 40 mg each day.  6. Lipitor 20 mg each evening.   FOLLOW UP:  As needed with Dr. Jarold Motto.  We will call the patient to check  on her.   DIET:  Soft, bland for the next one to three days, advance as tolerated.   ACTIVITY:  As tolerated.                                               Iva Boop, M.D. [facility]    CEG/MEDQ  D:  01/13/2003  T:  01/13/2003  Job:  [id number]   cc:   Vania Rea. Jarold Motto, M.D. [facility]   Al Decant. Janey Greaser, MD  [address]  [place]  [place] [address]  Fax: [phone]

## 2010-07-10 NOTE — Discharge Summary (Signed)
NAME:  [name] [name], [name] [name]                   ACCOUNT NO.:  [id number]   MEDICAL RECORD NO.:  [id number]          PATIENT TYPE:  INP   LOCATION:  3707                         FACILITY:  [facility]   PHYSICIAN:  Jackie Plum, M.D.DATE OF BIRTH:  April 20, 1965   DATE OF ADMISSION:  06/14/2005  DATE OF DISCHARGE:  06/17/2005                                 DISCHARGE SUMMARY   DISCHARGE DIAGNOSES:  1.  Recurrent atypical chest pain, resolved.      1.  Adenosine Cardiolite done today, June 16, 2005, was negative for          any cardiac ischemia.      2.  Outpatient gastroenterology evaluation by the patient's outpatient          gastroenterologist, Dr. Jarold Motto, who recommended the patient be          started on Prevacid.  2.  History of diabetes.  3.  Dyslipidemia.  4.  Hypertension.  5.  Strong family history of coronary artery disease.  6.  History of colon polyps.   CONSULTANTS:  Meade Maw, M.D., of [facility].   PROCEDURES:  Adenosine Cardiolite as noted above.   CONDITION ON DISCHARGE:  Improved and satisfactory.   DISCHARGE MEDICATIONS:  Please see patient admission H&P.  The patient will  continue her current outpatient medicines.  New medicine is Prevacid 30 mg  daily.   REASON FOR ADMISSION:  Chest pain.   For full details regarding the patient's presentation and evaluation on  admission, please see H&P dictated by Sherin Quarry, M.D., on June 14, 2005.   The patient is a 45 year old lady with significant coronary artery disease  risk factors, who presented with recurrent chest tightness.  The pain was  atypical on presentation.  The patient apparently received nitroglycerin  with some improvement in her pain and therefore was admitted for further  evaluation in this regard, so the patient was admitted for evaluation;  however, we could not get a stress test done overnight and her stay was  increased by one more day.  The patient was seen in consultation by [facility] yesterday, June 15, 2005, and they felt that the patient's chest  pain was unlikely to be cardiac, however because of her significant cardiac  risk factors agreed to a stress test.  They felt that maybe her pain is  exacerbated by stress.  The patient ruled out for MI by serial cardiac  enzymes.  She had a Cardiolite done today and it came back to be negative  for any ischemia, and that this is likely stress-related, however possibly  GI-related.  The patient is therefore going to be discharged on Prevacid and  p.r.n. Maalox and to follow up with Dr. Jarold Motto, whom the patient has seen  previously, for further evaluation in terms of any GI cause of pain  including dyspepsia .  On rounds today, the patient is fine.  She denies any  chest pain or shortness of breath.  She feels she is comfortable.  The  cardiopulmonary exam is unremarkable.  Her vital signs  are stable, and she  is going to be discharged with outpatient follow-up as noted above.   DISCHARGE LABORATORY DATA:  WBC count 7.5, hemoglobin 13.1, hematocrit 38.5,  MCV 82.1, platelet count 261.  Sodium 134, potassium 3.6, chloride 101, CO2  28, glucose 187, BUN 13, creatinine 0.7, calcium 8.8.  Hemoglobin A1c is  9.8% on June 14, 2005, and she will need continued evaluation, management  and optimization of her overall diabetic control.  Her total cholesterol was  245 with HDL of 35, LDL of 162.  She will also need continued evaluation,  management and optimization of her dyslipidemia as deemed appropriate by her  PCP.  The patient is to follow up with Dr. Jarold Motto of GI medicine in about  10-14 days and to Dr. Joselyn Arrow in about three weeks.  She is to call for an  appointment.  She is to report to her doctor if there are any problems.      Jackie Plum, M.D.  Electronically Signed     GO/MEDQ  D:  06/16/2005  T:  06/17/2005  Job:  [id number]   cc:   Lavonda Jumbo, M.D.  Fax: [phone]   Vania Rea. Jarold Motto,  M.D. [facility]  [address]  [place]  [place] [address]

## 2010-07-10 NOTE — H&P (Signed)
NAME:  [name] [name], [name] [name]                             ACCOUNT NO.:  [id number]   MEDICAL RECORD NO.:  [id number]                   PATIENT TYPE:  INP   LOCATION:  0101                                 FACILITY:  [facility]   PHYSICIAN:  Iva Boop, M.D. [facility]           DATE OF BIRTH:  March 15, 1965   DATE OF ADMISSION:  01/12/2003  DATE OF DISCHARGE:                                HISTORY & PHYSICAL   CHIEF COMPLAINT:  Passing blood after colonoscopy.   HISTORY:  This is a 45 year old woman who underwent a colonoscopy by Dr.  Sheryn Bison on January 04, 2003.  She had a pedunculated rectal polyp  and some smaller adjacent nodules that were snare excised.  She did well  until this afternoon or this evening when she passed some bloody clots.  She  called me, and I asked her to come to the emergency room for evaluation.  She passed some red blood with clots unlike any other bleeding she had had  in the past from her hemorrhoids.  She does suffer from some chronic  constipation, and is having some mild left lower quadrant discomfort.  She  is otherwise in no acute distress.  She has not vomited.  There is no fever.  She has felt a little bit dizzy and weak.  She was not very hungry today.  She recently finished amoxicillin for a sinus infection.  She did not take  nonsteroidals, avoiding them as recommended.  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   REVIEW OF SYSTEMS:  Otherwise negative.   DRUG ALLERGIES:  None known.   MEDICATIONS:  1. Lipitor 20 mg daily.  2. Glucophage XR 1000 mg q.h.s.  3. Actos 15 mg daily.  4. Lisinopril 10 mg daily.  5. Colace.  6. She has finished the amoxicillin.   PAST MEDICAL HISTORY:  1. Hypertension.  2. Type 2 diabetes mellitus.  3. Endometriosis.  4. Hysterectomy without oophorectomy.  5. Laparoscopy x3.   FAMILY HISTORY:   Noncontributory.   SOCIAL HISTORY:  She is married.  Children.  No alcohol or tobacco.   PHYSICAL EXAMINATION:  GENERAL:  A pleasant, well-nourished, overweight to  obese white woman in no acute distress.  VITAL SIGNS:  Blood pressure 123/80, pulse 71.  HEENT:  Eyes - anicteric.  NECK:  Supple  CHEST:  Clear.  HEART:  S1 and S2 without any murmurs, rubs, or gallops.  ABDOMEN:  Soft.  Mild left lower quadrant tenderness.  Bowel sounds are  present.  RECTAL:  There are external hemorrhoids and red blood staining the area.  Insertion of a gloved finger in the presence of a female chaperone shows  some red blood and a minor amount of clot.  She is mildly to moderately  tender, as well.  There is no obvious fissure or anything like that.  EXTREMITIES:  No edema.  SKIN:  No rash.  NEUROLOGIC:  She is alert and oriented x3.   LABORATORY DATA:  CBC shows hemoglobin of 13.1, hematocrit 38.5, white count  6.4, MCV 80.8, platelet count 310.   ASSESSMENT:  Post-polypectomy bleeding, almost certainly from her rectal  polypectomy.  At this point it is mild, hopefully self limited, though we  don't know.  She suffers from chronic constipation and mild left lower  quadrant tenderness, which she says is not really a new problem for her.  She gets the tenderness at times.  She also has diabetes mellitus and  hypertension.   PLAN:  Admit and observe for further bleeding.  Mild laxatives with milk of  magnesia.  Hydrate.  She might need a proctoscopy or flexible sigmoidoscopy,  depending on the clinical course.                                               Iva Boop, M.D. [facility]    CEG/MEDQ  D:  01/12/2003  T:  01/13/2003  Job:  [id number]   cc:   Vania Rea. Jarold Motto, M.D. [facility]   Al Decant. Janey Greaser, MD  [address]  [place]  [place] [address]  Fax: [phone]

## 2010-07-21 ENCOUNTER — Telehealth: Payer: Self-pay | Admitting: Internal Medicine

## 2010-07-21 NOTE — Telephone Encounter (Signed)
Refill-amlodipine besylate 2.5mg  tab. Take 1 tablet by mouth every day. Qty 30. Last fill 4.30.12  Refill- novolog flexpen syringe. Inject 15-30 units into the skin 3 times daily before meals. 15.0 ml. Last fill 4.30.12  Refill- lisinopril 20 mg tablet. Take 1 tablet by mouth every day. Qty 30. Last fill 4.30.12  Refill-pravastatin sodium 40mg  tab. Take 1 tablet by mouth every day. Qty 30 last fill 4.30.12  Refill- lantus solostar 100 units/ml. Inject 50-60 units into the skin daily. Qty 15.0 ml. Last fill 4.30.12

## 2010-07-21 NOTE — Telephone Encounter (Signed)
Patient will call pharmacy for refill/  appt rescheduled for  June  20th

## 2010-07-22 MED ORDER — INSULIN ASPART 100 UNIT/ML ~~LOC~~ SOLN: 15.0000 [IU] | Freq: Three times a day (TID) | SUBCUTANEOUS | Status: DC

## 2010-07-22 MED ORDER — LISINOPRIL 20 MG PO TABS: 20.0000 mg | ORAL_TABLET | Freq: Every day | ORAL | Status: DC

## 2010-07-22 MED ORDER — AMLODIPINE BESYLATE 2.5 MG PO TABS: 2.5000 mg | ORAL_TABLET | Freq: Every day | ORAL | Status: DC

## 2010-07-22 MED ORDER — PRAVASTATIN SODIUM 40 MG PO TABS: 40.0000 mg | ORAL_TABLET | Freq: Every day | ORAL | Status: DC

## 2010-07-22 MED ORDER — INSULIN GLARGINE 100 UNIT/ML ~~LOC~~ SOLN: 50.0000 [IU] | Freq: Every day | SUBCUTANEOUS | Status: DC

## 2010-07-22 NOTE — Telephone Encounter (Signed)
noted 

## 2010-07-22 NOTE — Assessment & Plan Note (Addendum)
Pt stopped crestor due to side effect of myalgia.  Trial of pravastatin.

## 2010-07-22 NOTE — Assessment & Plan Note (Addendum)
Pt with poor diabetes control.  Diabetes mgt limited by poor patient compliance.   Pt counseled on potential microvascular and macrovascular complications of diabetes. Pt overdue for diabetes eye exam.  Pt declines referral to diabetic educator.  Recent HgA1c 11.5  Restart metformin and insulin.    If persistent poor compliance, pt understands she may be discharged from practice.

## 2010-07-22 NOTE — Telephone Encounter (Signed)
Requested meds filled for 30 day supply x no refills. Pt has f/u 08/13/10.

## 2010-07-23 ENCOUNTER — Ambulatory Visit: Payer: 59 | Admitting: Internal Medicine

## 2010-07-25 NOTE — Assessment & Plan Note (Addendum)
Pt recently restarted her meds.  BP at goal.  Continue current medication regimen.  BP: 110/72 mmHg   Lab Results  Component Value Date   CREATININE 0.67 06/15/2010

## 2010-07-29 NOTE — Telephone Encounter (Signed)
Received call from pharmacy stating Novolog quantity of 15mL sent on 07/22/10 will only be a 15 day supply. Advised Herbert Seta that it would be ok to give an additional 15mL supply to last pt 1 month.

## 2010-07-30 ENCOUNTER — Telehealth: Payer: Self-pay | Admitting: Internal Medicine

## 2010-07-30 NOTE — Telephone Encounter (Signed)
REFILL LANTUS SOLOSTAR 100 UNIT ML 15 ML LAST FILL 07-22-2010 INJECT 50-60 UNITS INTO THE SKIN DAILY

## 2010-07-31 MED ORDER — INSULIN GLARGINE 100 UNIT/ML ~~LOC~~ SOLN: 50.0000 [IU] | Freq: Every day | SUBCUTANEOUS | Status: DC

## 2010-07-31 NOTE — Telephone Encounter (Signed)
Rx refill sent to pharmacy. 

## 2010-08-13 ENCOUNTER — Ambulatory Visit: Payer: 59 | Admitting: Internal Medicine

## 2010-08-13 ENCOUNTER — Ambulatory Visit (INDEPENDENT_AMBULATORY_CARE_PROVIDER_SITE_OTHER): Payer: 59 | Admitting: Internal Medicine

## 2010-08-13 ENCOUNTER — Encounter: Payer: Self-pay | Admitting: Internal Medicine

## 2010-08-13 DIAGNOSIS — IMO0001 Reserved for inherently not codable concepts without codable children: Secondary | ICD-10-CM

## 2010-08-13 DIAGNOSIS — E785 Hyperlipidemia, unspecified: Secondary | ICD-10-CM

## 2010-08-13 DIAGNOSIS — I10 Essential (primary) hypertension: Secondary | ICD-10-CM

## 2010-08-13 DIAGNOSIS — E119 Type 2 diabetes mellitus without complications: Secondary | ICD-10-CM

## 2010-08-13 MED ORDER — INSULIN ASPART 100 UNIT/ML ~~LOC~~ SOLN: SUBCUTANEOUS | Status: DC

## 2010-08-13 MED ORDER — INSULIN GLARGINE 100 UNIT/ML ~~LOC~~ SOLN: SUBCUTANEOUS | Status: DC

## 2010-08-13 MED ORDER — PRAVASTATIN SODIUM 40 MG PO TABS: 40.0000 mg | ORAL_TABLET | Freq: Every day | ORAL | Status: DC

## 2010-08-13 MED ORDER — AMLODIPINE BESYLATE 2.5 MG PO TABS: 2.5000 mg | ORAL_TABLET | Freq: Every day | ORAL | Status: DC

## 2010-08-13 MED ORDER — LISINOPRIL 20 MG PO TABS: 20.0000 mg | ORAL_TABLET | Freq: Every day | ORAL | Status: DC

## 2010-08-15 ENCOUNTER — Encounter: Payer: Self-pay | Admitting: Internal Medicine

## 2010-08-15 NOTE — Assessment & Plan Note (Signed)
Stable. Tolerating pravastatin. Obtain flp/lft with next visit.

## 2010-08-15 NOTE — Assessment & Plan Note (Signed)
Normotensive and stable. Continue current regimen.

## 2010-08-15 NOTE — Progress Notes (Signed)
  Subjective:    Patient ID: [name] [name], female    DOB: August 01, 1965, 45 y.o.   MRN: [id number]  HPI Pt presents to clinic for follow up of DM. Previously under poor control has now resumed insulin. Last A1c 11.5 ~283 and most recent fsbs 150-160's with log of 74. Currently taking lantus 65 units qhs and 15-18 units of humalog. S/p optho evaluation and was told had retinopathy with follow up exams scheduled. Has mild intermittent plantar foot paresthesias. H/o chronic lbp s/p surgery that limits exercise some. Tolerates statin tx without myalgias. Finds insulin pens costly. No other complaints.  Reviewed pmh, medications and allergies.    Review of Systems  Constitutional: Negative for fever and chills.  Respiratory: Negative for cough and shortness of breath.   Cardiovascular: Negative for chest pain.  Gastrointestinal: Negative for abdominal pain.  Musculoskeletal: Positive for back pain. Negative for gait problem.       Objective:   Physical Exam  Nursing note and vitals reviewed. Constitutional: She appears well-developed and well-nourished. No distress.  HENT:  Head: Normocephalic and atraumatic.  Right Ear: External ear normal.  Left Ear: External ear normal.  Eyes: Conjunctivae are normal.  Neck: Neck supple. No JVD present.  Cardiovascular: Normal rate, regular rhythm and normal heart sounds.  Exam reveals no gallop and no friction rub.   No murmur heard. Pulmonary/Chest: Effort normal and breath sounds normal. No respiratory distress. She has no wheezes. She has no rales.  Lymphadenopathy:    She has no cervical adenopathy.  Neurological: She is alert.  Skin: Skin is warm and dry. No rash noted. She is not diaphoretic. No erythema.  Psychiatric: She has a normal mood and affect.          Assessment & Plan:

## 2010-08-15 NOTE — Assessment & Plan Note (Signed)
Improving control and compliance. Encouraged continued dietary modification and regular low impact exercise. Change insulin pens to vials for cost consideration. Increase lantus dose 3 units q 3 days until fasting fsbs consistently <130 without hypoglycemia. May achieve improvement of foot paresthesias and microalbuminuria with improved glycemic control.

## 2010-08-31 ENCOUNTER — Telehealth: Payer: Self-pay | Admitting: Internal Medicine

## 2010-08-31 DIAGNOSIS — IMO0001 Reserved for inherently not codable concepts without codable children: Secondary | ICD-10-CM

## 2010-08-31 NOTE — Telephone Encounter (Signed)
Refill- metformin hcl 500mg  tablet. Take 1 tablet by mouth twice a day with a meal. Qty 60. Last fill 4.30.12

## 2010-09-01 ENCOUNTER — Telehealth: Payer: Self-pay | Admitting: Internal Medicine

## 2010-09-01 DIAGNOSIS — IMO0001 Reserved for inherently not codable concepts without codable children: Secondary | ICD-10-CM

## 2010-09-01 MED ORDER — METFORMIN HCL 500 MG PO TABS: 500.0000 mg | ORAL_TABLET | Freq: Two times a day (BID) | ORAL | Status: DC

## 2010-09-01 NOTE — Telephone Encounter (Signed)
Refill- metformin hcl 500mg  tablet. Take one tablet by mouth twice a day with a meal. Qty 60. Last fill 4.30.12

## 2010-09-01 NOTE — Telephone Encounter (Signed)
Pt has follow up on 11/09/10.  Refills sent until then.

## 2010-09-02 MED ORDER — METFORMIN HCL 500 MG PO TABS: 500.0000 mg | ORAL_TABLET | Freq: Two times a day (BID) | ORAL | Status: DC

## 2010-09-02 NOTE — Telephone Encounter (Signed)
Refill sent to pharmacy for #60 x 2 refills.

## 2010-09-07 ENCOUNTER — Telehealth: Payer: Self-pay | Admitting: Internal Medicine

## 2010-09-07 NOTE — Telephone Encounter (Signed)
Refill-novolog 100 unit/ml vial. Inject 15 units sub-q before meals. Qty 20.  Pharmacist comments: insurance prefers humalog- may we change? copay for novolog $50.

## 2010-09-09 NOTE — Telephone Encounter (Signed)
Is it OK to change to humalog?  Any changes in dose?  Please advise.

## 2010-09-09 NOTE — Telephone Encounter (Signed)
ok 

## 2010-09-10 MED ORDER — INSULIN LISPRO 100 UNIT/ML ~~LOC~~ SOLN: SUBCUTANEOUS | Status: DC

## 2010-09-10 NOTE — Telephone Encounter (Signed)
humalog RX sent to pharm.

## 2010-11-09 ENCOUNTER — Ambulatory Visit (INDEPENDENT_AMBULATORY_CARE_PROVIDER_SITE_OTHER): Payer: 59 | Admitting: Internal Medicine

## 2010-11-09 ENCOUNTER — Telehealth: Payer: Self-pay | Admitting: Internal Medicine

## 2010-11-09 ENCOUNTER — Ambulatory Visit: Payer: 59 | Admitting: Internal Medicine

## 2010-11-09 ENCOUNTER — Encounter: Payer: Self-pay | Admitting: Internal Medicine

## 2010-11-09 DIAGNOSIS — E119 Type 2 diabetes mellitus without complications: Secondary | ICD-10-CM

## 2010-11-09 DIAGNOSIS — IMO0001 Reserved for inherently not codable concepts without codable children: Secondary | ICD-10-CM

## 2010-11-09 DIAGNOSIS — Z23 Encounter for immunization: Secondary | ICD-10-CM

## 2010-11-09 DIAGNOSIS — E785 Hyperlipidemia, unspecified: Secondary | ICD-10-CM

## 2010-11-09 DIAGNOSIS — I10 Essential (primary) hypertension: Secondary | ICD-10-CM

## 2010-11-09 NOTE — Patient Instructions (Signed)
Please increase your lantus dose 3 units every 3 days until your morning fasting blood sugars are consistently less than 130 without lows. Please schedule chem7, a1c 250.0 prior to next visit

## 2010-11-09 NOTE — Telephone Encounter (Signed)
Patient needs labs one week prior to 02-08-11 visit. She will be going downstairs to [employer], a1c 250.00

## 2010-11-10 LAB — BASIC METABOLIC PANEL
BUN: 17 mg/dL (ref 6–23)
CO2: 29 mEq/L (ref 19–32)
Calcium: 9.6 mg/dL (ref 8.4–10.5)
Chloride: 102 mEq/L (ref 96–112)
Creat: 0.61 mg/dL (ref 0.50–1.10)
Glucose, Bld: 170 mg/dL — ABNORMAL HIGH (ref 70–99)
Potassium: 3.8 mEq/L (ref 3.5–5.3)
Sodium: 138 mEq/L (ref 135–145)

## 2010-11-10 LAB — LIPID PANEL
Cholesterol: 215 mg/dL — ABNORMAL HIGH (ref 0–200)
HDL: 41 mg/dL (ref >39)
LDL Cholesterol: 131 mg/dL — ABNORMAL HIGH (ref 0–99)
Total CHOL/HDL Ratio: 5.2 Ratio
Triglycerides: 213 mg/dL — ABNORMAL HIGH (ref <150)
VLDL: 43 mg/dL — ABNORMAL HIGH (ref 0–40)

## 2010-11-10 LAB — HEPATIC FUNCTION PANEL
ALT: 42 U/L — ABNORMAL HIGH (ref 0–35)
AST: 24 U/L (ref 0–37)
Albumin: 4.5 g/dL (ref 3.5–5.2)
Alkaline Phosphatase: 92 U/L (ref 39–117)
Bilirubin, Direct: 0.1 mg/dL (ref 0.0–0.3)
Indirect Bilirubin: 0.3 mg/dL (ref 0.0–0.9)
Total Bilirubin: 0.4 mg/dL (ref 0.3–1.2)
Total Protein: 7.4 g/dL (ref 6.0–8.3)

## 2010-11-10 LAB — MICROALBUMIN / CREATININE URINE RATIO
Creatinine, Urine: 135.7 mg/dL
Microalb Creat Ratio: 4.1 mg/g (ref 0.0–30.0)
Microalb, Ur: 0.56 mg/dL (ref 0.00–1.89)

## 2010-11-10 LAB — CBC
HCT: 41.7 % (ref 36.0–46.0)
Hemoglobin: 13.8 g/dL (ref 12.0–15.0)
MCH: 26.3 pg (ref 26.0–34.0)
MCHC: 33.1 g/dL (ref 30.0–36.0)
MCV: 79.4 fL (ref 78.0–100.0)
Platelets: 254 10*3/uL (ref 150–400)
RBC: 5.25 MIL/uL — ABNORMAL HIGH (ref 3.87–5.11)
RDW: 14 % (ref 11.5–15.5)
WBC: 5.7 10*3/uL (ref 4.0–10.5)

## 2010-11-10 LAB — HEMOGLOBIN A1C
Hgb A1c MFr Bld: 9.6 % — ABNORMAL HIGH (ref <5.7)
Mean Plasma Glucose: 229 mg/dL — ABNORMAL HIGH (ref <117)

## 2010-11-11 NOTE — Telephone Encounter (Signed)
Lab orders entered for December 2012. 

## 2010-11-15 ENCOUNTER — Encounter: Payer: Self-pay | Admitting: Internal Medicine

## 2010-11-15 NOTE — Assessment & Plan Note (Signed)
Monitor bp. If continues low nl may be able to dc norvasc

## 2010-11-15 NOTE — Progress Notes (Signed)
  Subjective:    Patient ID: [name] [name], female    DOB: October 14, 1965, 45 y.o.   MRN: [id number]  HPI Pt presents to clinic for followup of multiple medical problems. bp low nl without dizziness. Diabetes control is improving. Recent fsbs 130-179 without hypoglycemia. Stopped metformin due to gi upset. Tolerates statin tx without myalgias. Has begun to exercise. No other complaints.  Past Medical History  Diagnosis Date  . Hyperlipidemia   . Hypertension   . Diabetes mellitus     type 2 uncontrolled  . Depression   . Inflammatory bowel disease   . Head ache     daily  . History of DVT (deep vein thrombosis) 2000  . Chronic back pain    Past Surgical History  Procedure Date  . Spine surgery 03/2008    Dr Alveda Reasons  . Oophorectomy 2008  . Abdominal hysterectomy 1990  . Pelvic laparoscopy     for endometriosis    reports that she has never smoked. She has never used smokeless tobacco. She reports that she does not drink alcohol or use illicit drugs. family history includes Alcohol abuse in her other; Arthritis in her other; Cancer in her other; Diabetes in her other; Heart disease in her other; Hyperlipidemia in her other; Hypertension in her other; Kidney disease in her other; and Stroke in her other. No Known Allergies   Review of Systems see hpi     Objective:   Physical Exam  Physical Exam  Nursing note and vitals reviewed. Constitutional: Appears well-developed and well-nourished. No distress.  HENT:  Head: Normocephalic and atraumatic.  Right Ear: External ear normal.  Left Ear: External ear normal.  Eyes: Conjunctivae are normal. No scleral icterus.  Neck: Neck supple. Carotid bruit is not present.  Cardiovascular: Normal rate, regular rhythm and normal heart sounds.  Exam reveals no gallop and no friction rub.   No murmur heard. Pulmonary/Chest: Effort normal and breath sounds normal. No respiratory distress. He has no wheezes. no rales.  Lymphadenopathy:    He has no  cervical adenopathy.  Neurological:Alert.  Skin: Skin is warm and dry. Not diaphoretic.  Psychiatric: Has a normal mood and affect.        Assessment & Plan:

## 2010-11-15 NOTE — Assessment & Plan Note (Signed)
Improving control. Increase lantus 3 units every 3 days until fasting fsbs consistently less than 130 without hypoglycemia. Encouraged diet, exercise and wt loss. Obtain cbc, chem7, a1c, urine microalbumin. a1c may not fully represent diabetic control with recently improving fsbs.

## 2010-11-15 NOTE — Assessment & Plan Note (Signed)
Obtain lipid/lft. ldl goal <100. 

## 2010-11-16 ENCOUNTER — Telehealth: Payer: Self-pay

## 2010-11-16 DIAGNOSIS — E785 Hyperlipidemia, unspecified: Secondary | ICD-10-CM

## 2010-11-16 NOTE — Telephone Encounter (Signed)
Patient informed of lab results and stated she was switched to Pravachol from Lipitor because it gave her leg cramps?

## 2010-11-17 NOTE — Telephone Encounter (Signed)
If cannot tolerate lipitor then equivalent could be crestor 10mg  qd

## 2010-11-18 NOTE — Telephone Encounter (Signed)
Call placed to patient at [phone], she was informed per Dr Rodena Medin instructions, to try Crestor. Patient stated that she has also tried the Crestor awhile back, and also had problems with it. She stated that she would prefer to stay on the Pravastatin,and that her numbers may have been affected because she has not taken her medication on regular basis and had stopped about a year. She was reminded of her appointment for December 17th of 2012, and advised of fasting blood work prior to that appointment.

## 2010-11-18 NOTE — Telephone Encounter (Signed)
Addended by: Glendell Docker on: 11/18/2010 03:11 PM   Modules accepted: Orders

## 2010-12-07 LAB — COMPREHENSIVE METABOLIC PANEL
ALT: 49 — ABNORMAL HIGH
AST: 33
Albumin: 3.8
Alkaline Phosphatase: 67
BUN: 18
CO2: 26
Calcium: 8.8
Chloride: 103
Creatinine, Ser: 0.71
GFR calc Af Amer: >60
GFR calc non Af Amer: >60
Glucose, Bld: 182 — ABNORMAL HIGH
Potassium: 4.4
Sodium: 137
Total Bilirubin: 0.7
Total Protein: 6.7

## 2010-12-07 LAB — CBC
HCT: 42.1
Hemoglobin: 14.1
MCHC: 33.6
MCV: 80.7
Platelets: 291
RBC: 5.22 — ABNORMAL HIGH
RDW: 13.7
WBC: 13.2 — ABNORMAL HIGH

## 2010-12-07 LAB — URINALYSIS, ROUTINE W REFLEX MICROSCOPIC
Bilirubin Urine: NEGATIVE
Glucose, UA: NEGATIVE
Hgb urine dipstick: NEGATIVE
Ketones, ur: 15 — AB
Nitrite: NEGATIVE
Protein, ur: NEGATIVE
Specific Gravity, Urine: 1.022
Urobilinogen, UA: 0.2
pH: 6

## 2010-12-07 LAB — DIFFERENTIAL
Basophils Absolute: 0
Basophils Relative: 0
Eosinophils Absolute: 0
Eosinophils Relative: 0
Lymphocytes Relative: 2 — ABNORMAL LOW
Lymphs Abs: 0.3 — ABNORMAL LOW
Monocytes Absolute: 0.1 — ABNORMAL LOW
Monocytes Relative: 1 — ABNORMAL LOW
Neutro Abs: 12.7 — ABNORMAL HIGH
Neutrophils Relative %: 97 — ABNORMAL HIGH

## 2010-12-07 LAB — LIPASE, BLOOD: Lipase: 17

## 2010-12-10 LAB — BASIC METABOLIC PANEL
BUN: 16
CO2: 25
Calcium: 9.3
Chloride: 104
Creatinine, Ser: 0.76
GFR calc Af Amer: >60
GFR calc non Af Amer: >60
Glucose, Bld: 247 — ABNORMAL HIGH
Potassium: 4.2
Sodium: 138

## 2010-12-10 LAB — CBC
HCT: 36
HCT: 42.7
Hemoglobin: 12.2
Hemoglobin: 14.2
MCHC: 33.4
MCHC: 34
MCV: 80.7
MCV: 80.9
Platelets: 252
Platelets: 265
RBC: 4.45
RBC: 5.28 — ABNORMAL HIGH
RDW: 13.6
RDW: 14.1 — ABNORMAL HIGH
WBC: 5.6
WBC: 5.8

## 2011-01-13 ENCOUNTER — Other Ambulatory Visit: Payer: Self-pay | Admitting: Internal Medicine

## 2011-01-13 NOTE — Telephone Encounter (Signed)
Rx refill sent to pharmacy. 

## 2011-01-13 NOTE — Telephone Encounter (Signed)
Rx already addressed

## 2011-02-08 ENCOUNTER — Ambulatory Visit (INDEPENDENT_AMBULATORY_CARE_PROVIDER_SITE_OTHER): Payer: 59 | Admitting: Internal Medicine

## 2011-02-08 ENCOUNTER — Encounter: Payer: Self-pay | Admitting: Internal Medicine

## 2011-02-08 VITALS — BP 126/80 | HR 82 | Temp 98.2°F | Resp 20 | Wt 222.0 lb

## 2011-02-08 DIAGNOSIS — F329 Major depressive disorder, single episode, unspecified: Secondary | ICD-10-CM

## 2011-02-08 DIAGNOSIS — E119 Type 2 diabetes mellitus without complications: Secondary | ICD-10-CM

## 2011-02-08 DIAGNOSIS — E785 Hyperlipidemia, unspecified: Secondary | ICD-10-CM

## 2011-02-08 DIAGNOSIS — IMO0001 Reserved for inherently not codable concepts without codable children: Secondary | ICD-10-CM

## 2011-02-08 DIAGNOSIS — F3289 Other specified depressive episodes: Secondary | ICD-10-CM

## 2011-02-08 MED ORDER — BUPROPION HCL ER (XL) 150 MG PO TB24: 150.0000 mg | ORAL_TABLET | Freq: Every day | ORAL | Status: DC

## 2011-02-09 LAB — HEMOGLOBIN A1C
Hgb A1c MFr Bld: 11.1 % — ABNORMAL HIGH (ref <5.7)
Mean Plasma Glucose: 272 mg/dL — ABNORMAL HIGH (ref <117)

## 2011-02-09 LAB — BASIC METABOLIC PANEL
BUN: 12 mg/dL (ref 6–23)
CO2: 26 mEq/L (ref 19–32)
Calcium: 9.8 mg/dL (ref 8.4–10.5)
Chloride: 101 mEq/L (ref 96–112)
Creat: 0.66 mg/dL (ref 0.50–1.10)
Glucose, Bld: 149 mg/dL — ABNORMAL HIGH (ref 70–99)
Potassium: 3.8 mEq/L (ref 3.5–5.3)
Sodium: 138 mEq/L (ref 135–145)

## 2011-02-14 NOTE — Progress Notes (Signed)
  Subjective:    Patient ID: [name] [name], female    DOB: 1965/03/22, 45 y.o.   MRN: [id number]  HPI Pt presents to clinic for followup of multiple medical problems. Notes increased stressors with mother's passing and other family members medical problems. Taking pravachol intermittently due to myalgias. Has increased lantus to 65-75 units and humalog 15-18. With stress fsbs has ranged from 89-400. No other complaints.  Past Medical History  Diagnosis Date  . Hyperlipidemia   . Hypertension   . Diabetes mellitus     type 2 uncontrolled  . Depression   . Inflammatory bowel disease   . Head ache     daily  . History of DVT (deep vein thrombosis) 2000  . Chronic back pain    Past Surgical History  Procedure Date  . Spine surgery 03/2008    Dr Alveda Reasons  . Oophorectomy 2008  . Abdominal hysterectomy 1990  . Pelvic laparoscopy     for endometriosis    reports that she has never smoked. She has never used smokeless tobacco. She reports that she does not drink alcohol or use illicit drugs. family history includes Alcohol abuse in her other; Arthritis in her other; Cancer in her other; Diabetes in her other; Heart disease in her other; Hyperlipidemia in her other; Hypertension in her other; Kidney disease in her other; and Stroke in her other. No Known Allergies    Review of Systems see hpi     Objective:   Physical Exam  Physical Exam  Nursing note and vitals reviewed. Constitutional: Appears well-developed and well-nourished. No distress.  HENT:  Head: Normocephalic and atraumatic.  Right Ear: External ear normal.  Left Ear: External ear normal.  Eyes: Conjunctivae are normal. No scleral icterus.  Neck: Neck supple. Carotid bruit is not present.  Cardiovascular: Normal rate, regular rhythm and normal heart sounds.  Exam reveals no gallop and no friction rub.   No murmur heard. Pulmonary/Chest: Effort normal and breath sounds normal. No respiratory distress. He has no wheezes. no  rales.  Lymphadenopathy:    He has no cervical adenopathy.  Neurological:Alert.  Skin: Skin is warm and dry. Not diaphoretic.  Psychiatric: Has a normal mood and affect.        Assessment & Plan:

## 2011-02-14 NOTE — Assessment & Plan Note (Signed)
Stop pravastatin. Attempt livalo samples 2mg  qd.

## 2011-02-14 NOTE — Assessment & Plan Note (Signed)
Attempt wellbutrin

## 2011-02-14 NOTE — Assessment & Plan Note (Signed)
Obtain chem7, a1c 

## 2011-03-01 ENCOUNTER — Telehealth: Payer: Self-pay | Admitting: *Deleted

## 2011-03-01 NOTE — Telephone Encounter (Signed)
Pt notified of results and states that she will call us back with fsbs readings. Pt reports that side effects of Livalo are not as bad as with her previous meds but still has cramping in her legs and feet at night and has noticed increased joint pain in her knees. Please advise.

## 2011-03-01 NOTE — Telephone Encounter (Signed)
Message copied by Kathi Simpers on Mon Mar 01, 2011  9:01 AM ------      Message from: Staci Righter.      Created: Thu Feb 18, 2011  8:11 PM       a1c high. Need fsbs log this week or late next week. Also if tolerating livalo 2mg  samples then can fill prescription #30 [id number]

## 2011-03-02 NOTE — Telephone Encounter (Signed)
If she finds the sx's tolerable/acceptable then fill it.

## 2011-03-03 NOTE — Telephone Encounter (Signed)
Call returned to patient at [phone], she was advised per Dr Rodena Medin instructions and stated that she will give the medication a try.

## 2011-03-15 ENCOUNTER — Ambulatory Visit (INDEPENDENT_AMBULATORY_CARE_PROVIDER_SITE_OTHER): Payer: 59 | Admitting: Internal Medicine

## 2011-03-15 ENCOUNTER — Encounter: Payer: Self-pay | Admitting: Internal Medicine

## 2011-03-15 VITALS — BP 124/80 | HR 82 | Temp 97.9°F | Resp 18 | Ht 67.0 in | Wt 221.0 lb

## 2011-03-15 DIAGNOSIS — Z1231 Encounter for screening mammogram for malignant neoplasm of breast: Secondary | ICD-10-CM

## 2011-03-15 DIAGNOSIS — J40 Bronchitis, not specified as acute or chronic: Secondary | ICD-10-CM

## 2011-03-15 DIAGNOSIS — E785 Hyperlipidemia, unspecified: Secondary | ICD-10-CM

## 2011-03-15 DIAGNOSIS — E119 Type 2 diabetes mellitus without complications: Secondary | ICD-10-CM

## 2011-03-15 DIAGNOSIS — Z1239 Encounter for other screening for malignant neoplasm of breast: Secondary | ICD-10-CM

## 2011-03-15 DIAGNOSIS — F32A Depression, unspecified: Secondary | ICD-10-CM

## 2011-03-15 DIAGNOSIS — F329 Major depressive disorder, single episode, unspecified: Secondary | ICD-10-CM

## 2011-03-15 DIAGNOSIS — IMO0001 Reserved for inherently not codable concepts without codable children: Secondary | ICD-10-CM

## 2011-03-15 MED ORDER — DOXYCYCLINE HYCLATE 100 MG PO TABS: 100.0000 mg | ORAL_TABLET | Freq: Two times a day (BID) | ORAL | Status: AC

## 2011-03-15 MED ORDER — BUPROPION HCL ER (XL) 300 MG PO TB24: 300.0000 mg | ORAL_TABLET | Freq: Every day | ORAL | Status: DC

## 2011-03-15 NOTE — Assessment & Plan Note (Signed)
Improving. Increase wellbutrin xl 300mg  po qd.

## 2011-03-15 NOTE — Assessment & Plan Note (Signed)
Attempting livalo. Intolerant of pravastatin. Obtain lipid/lft prior to next visit

## 2011-03-15 NOTE — Assessment & Plan Note (Signed)
Begin po abx course. Followup if no improvement or worsening.  

## 2011-03-15 NOTE — Patient Instructions (Signed)
Please help [place] assist you in scheduling your mammogram at [facility] Schedule fasting labs prior to your next appointment- chem7, a1c-250.0 and lipid/lft-272.4

## 2011-03-15 NOTE — Progress Notes (Signed)
  Subjective:    Patient ID: [name] [name], female    DOB: 1965-09-30, 46 y.o.   MRN: [id number]  HPI Pt presents to clinic for followup of multiple medical problems. Mood improved with wellbutrin without side effect. Notes 2wk h/o cough productive for brown sputum without hemoptysis, fever or chills. Diabetes mellitus complicated by hyperglycemia thought recently fsbs 90-low 100's without hypoglycemia. No other complaints.   Past Medical History  Diagnosis Date  . Hyperlipidemia   . Hypertension   . Diabetes mellitus     type 2 uncontrolled  . Depression   . Inflammatory bowel disease   . Head ache     daily  . History of DVT (deep vein thrombosis) 2000  . Chronic back pain    Past Surgical History  Procedure Date  . Spine surgery 03/2008    Dr Alveda Reasons  . Oophorectomy 2008  . Abdominal hysterectomy 1990  . Pelvic laparoscopy     for endometriosis    reports that she has never smoked. She has never used smokeless tobacco. She reports that she does not drink alcohol or use illicit drugs. family history includes Alcohol abuse in her other; Arthritis in her other; Cancer in her other; Diabetes in her other; Heart disease in her other; Hyperlipidemia in her other; Hypertension in her other; Kidney disease in her other; and Stroke in her other. No Known Allergies   Review of Systems see hpi     Objective:   Physical Exam  Nursing note and vitals reviewed. Constitutional: She appears well-developed and well-nourished. No distress.  HENT:  Head: Normocephalic and atraumatic.  Pulmonary/Chest: Effort normal and breath sounds normal. No respiratory distress. She has no wheezes. She has no rales.  Neurological: She is alert.  Skin: Skin is warm and dry. She is not diaphoretic.  Psychiatric: She has a normal mood and affect.          Assessment & Plan:

## 2011-03-15 NOTE — Assessment & Plan Note (Signed)
Recent improving control. Continue fsbs monitoring. Obtain chem7 and a1c prior to next visit.

## 2011-03-29 ENCOUNTER — Ambulatory Visit
Admission: RE | Admit: 2011-03-29 | Discharge: 2011-03-29 | Disposition: A | Discharge status: Home / Self Care | Payer: 59 | Source: Ambulatory Visit | Attending: Internal Medicine | Admitting: Internal Medicine

## 2011-03-29 DIAGNOSIS — Z1231 Encounter for screening mammogram for malignant neoplasm of breast: Secondary | ICD-10-CM

## 2011-05-18 ENCOUNTER — Ambulatory Visit: Payer: 59 | Admitting: Internal Medicine

## 2011-05-25 LAB — BASIC METABOLIC PANEL
BUN: 16 mg/dL (ref 6–23)
CO2: 25 mEq/L (ref 19–32)
Calcium: 10.1 mg/dL (ref 8.4–10.5)
Chloride: 102 mEq/L (ref 96–112)
Creat: 0.67 mg/dL (ref 0.50–1.10)
Glucose, Bld: 183 mg/dL — ABNORMAL HIGH (ref 70–99)
Potassium: 3.9 mEq/L (ref 3.5–5.3)
Sodium: 139 mEq/L (ref 135–145)

## 2011-05-25 LAB — HEMOGLOBIN A1C
Hgb A1c MFr Bld: 10.6 % — ABNORMAL HIGH (ref <5.7)
Mean Plasma Glucose: 258 mg/dL — ABNORMAL HIGH (ref <117)

## 2011-05-25 NOTE — Telephone Encounter (Signed)
Addended by: Mervin Kung A on: 05/25/2011 01:41 PM   Modules accepted: Orders

## 2011-05-26 LAB — HEPATIC FUNCTION PANEL
ALT: 55 U/L — ABNORMAL HIGH (ref 0–35)
AST: 25 U/L (ref 0–37)
Albumin: 4.6 g/dL (ref 3.5–5.2)
Alkaline Phosphatase: 97 U/L (ref 39–117)
Bilirubin, Direct: 0.1 mg/dL (ref 0.0–0.3)
Indirect Bilirubin: 0.3 mg/dL (ref 0.0–0.9)
Total Bilirubin: 0.4 mg/dL (ref 0.3–1.2)
Total Protein: 7.4 g/dL (ref 6.0–8.3)

## 2011-05-26 LAB — LIPID PANEL
Cholesterol: 251 mg/dL — ABNORMAL HIGH (ref 0–200)
HDL: 40 mg/dL (ref >39)
LDL Cholesterol: 163 mg/dL — ABNORMAL HIGH (ref 0–99)
Total CHOL/HDL Ratio: 6.3 Ratio
Triglycerides: 240 mg/dL — ABNORMAL HIGH (ref <150)
VLDL: 48 mg/dL — ABNORMAL HIGH (ref 0–40)

## 2011-05-27 ENCOUNTER — Encounter: Payer: Self-pay | Admitting: Internal Medicine

## 2011-05-27 ENCOUNTER — Ambulatory Visit (INDEPENDENT_AMBULATORY_CARE_PROVIDER_SITE_OTHER): Payer: 59 | Admitting: Internal Medicine

## 2011-05-27 DIAGNOSIS — R079 Chest pain, unspecified: Secondary | ICD-10-CM | POA: Insufficient documentation

## 2011-05-27 DIAGNOSIS — E785 Hyperlipidemia, unspecified: Secondary | ICD-10-CM

## 2011-05-27 DIAGNOSIS — IMO0001 Reserved for inherently not codable concepts without codable children: Secondary | ICD-10-CM

## 2011-05-27 MED ORDER — NITROGLYCERIN 0.4 MG SL SUBL: 0.4000 mg | SUBLINGUAL_TABLET | SUBLINGUAL | Status: DC | PRN

## 2011-05-27 NOTE — Assessment & Plan Note (Signed)
EKG obtained demonstrates nsr 83 with nl intervals and axis. No evidence of acute ischemic change. Increase asa 325mg  po qd. Given risk factors provide with ntg sl prn and instruct to call 911 to present to the ED with further chest pain or pressure. States understanding. Recommend and will schedule cardiology consult.

## 2011-05-27 NOTE — Assessment & Plan Note (Signed)
Poor control. Increase lantus 70 units sq qd then 3 units q3 days until fasting am sugar <130. Increase humalog 40 qac consistently with further titration instructions provided. Consider endocrinology consult if remains suboptimal (discussed with pt)

## 2011-05-27 NOTE — Progress Notes (Signed)
  Subjective:    Patient ID: [name] [name], female    DOB: August 30, 1965, 46 y.o.   MRN: [id number]  HPI Pt presents to clinic for followup of multiple medical problems. Notes intermittent chest pressure feeling like bricks with associated left arm tingling. Duration ~15 minutes followed by spontaneous resolution. Discomfort was nonexertional. Reviewed past nl stress echo 03/2009 performed due to chest pain. Currently asx. Risk factors for CAD include DM and hyperlipidemia. Tolerating livalo without myalgias but ldl remains high. Blood sugar were improving since last visit however last 2 wks notes hyperglycemia and worsening control. fsbs range 200-400 without hypoglycemia. Taking lantus 65 units qd and humalog 30-40 units qac. Increased stress due to husband dx'ed with lung cancer.   Past Medical History  Diagnosis Date  . Hyperlipidemia   . Hypertension   . Diabetes mellitus     type 2 uncontrolled  . Depression   . Inflammatory bowel disease   . Head ache     daily  . History of DVT (deep vein thrombosis) 2000  . Chronic back pain    Past Surgical History  Procedure Date  . Spine surgery 03/2008    Dr Alveda Reasons  . Oophorectomy 2008  . Abdominal hysterectomy 1990  . Pelvic laparoscopy     for endometriosis    reports that she has never smoked. She has never used smokeless tobacco. She reports that she does not drink alcohol or use illicit drugs. family history includes Alcohol abuse in her other; Arthritis in her other; Cancer in her other; Diabetes in her other; Heart disease in her other; Hyperlipidemia in her other; Hypertension in her other; Kidney disease in her other; and Stroke in her other. No Known Allergies    Review of Systems see hpi     Objective:   Physical Exam  Physical Exam  Nursing note and vitals reviewed. Constitutional: Appears well-developed and well-nourished. No distress.  HENT:  Head: Normocephalic and atraumatic.  Right Ear: External ear normal.  Left Ear:  External ear normal.  Eyes: Conjunctivae are normal. No scleral icterus.  Neck: Neck supple. Carotid bruit is not present.  Cardiovascular: Normal rate, regular rhythm and normal heart sounds.  Exam reveals no gallop and no friction rub.   No murmur heard. Pulmonary/Chest: Effort normal and breath sounds normal. No respiratory distress. He has no wheezes. no rales.  Lymphadenopathy:    He has no cervical adenopathy.  Neurological:Alert.  Skin: Skin is warm and dry. Not diaphoretic.  Psychiatric: Has a normal mood and affect.        Assessment & Plan:

## 2011-05-27 NOTE — Assessment & Plan Note (Signed)
Chol increased despite statin. Tolerating current statin with past intolerances. Continue livalo and repeat lipid at f/u. If remains suboptimal change to alternative statin.

## 2011-06-01 ENCOUNTER — Ambulatory Visit (INDEPENDENT_AMBULATORY_CARE_PROVIDER_SITE_OTHER): Payer: 59 | Admitting: Cardiology

## 2011-06-01 ENCOUNTER — Encounter: Payer: Self-pay | Admitting: *Deleted

## 2011-06-01 ENCOUNTER — Encounter: Payer: Self-pay | Admitting: Cardiology

## 2011-06-01 VITALS — BP 146/98 | HR 88 | Ht 67.0 in | Wt 220.0 lb

## 2011-06-01 DIAGNOSIS — R079 Chest pain, unspecified: Secondary | ICD-10-CM

## 2011-06-01 DIAGNOSIS — R072 Precordial pain: Secondary | ICD-10-CM

## 2011-06-01 DIAGNOSIS — Z01810 Encounter for preprocedural cardiovascular examination: Secondary | ICD-10-CM

## 2011-06-01 DIAGNOSIS — I10 Essential (primary) hypertension: Secondary | ICD-10-CM

## 2011-06-01 DIAGNOSIS — E785 Hyperlipidemia, unspecified: Secondary | ICD-10-CM

## 2011-06-01 LAB — CBC WITH DIFFERENTIAL/PLATELET
Basophils Absolute: 0 10*3/uL (ref 0.0–0.1)
Basophils Relative: 0.3 % (ref 0.0–3.0)
Eosinophils Absolute: 0.1 10*3/uL (ref 0.0–0.7)
Eosinophils Relative: 1.7 % (ref 0.0–5.0)
HCT: 40.5 % (ref 36.0–46.0)
Hemoglobin: 13.5 g/dL (ref 12.0–15.0)
Lymphocytes Relative: 32.6 % (ref 12.0–46.0)
Lymphs Abs: 1.6 10*3/uL (ref 0.7–4.0)
MCHC: 33.3 g/dL (ref 30.0–36.0)
MCV: 80.7 fl (ref 78.0–100.0)
Monocytes Absolute: 0.3 10*3/uL (ref 0.1–1.0)
Monocytes Relative: 6.6 % (ref 3.0–12.0)
Neutro Abs: 3 10*3/uL (ref 1.4–7.7)
Neutrophils Relative %: 58.8 % (ref 43.0–77.0)
Platelets: 244 10*3/uL (ref 150.0–400.0)
RBC: 5.01 Mil/uL (ref 3.87–5.11)
RDW: 14.3 % (ref 11.5–14.6)
WBC: 5.1 10*3/uL (ref 4.5–10.5)

## 2011-06-01 LAB — PROTIME-INR
INR: 0.9 ratio (ref 0.8–1.0)
Prothrombin Time: 10.1 s — ABNORMAL LOW (ref 10.2–12.4)

## 2011-06-01 LAB — BASIC METABOLIC PANEL
BUN: 13 mg/dL (ref 6–23)
CO2: 24 mEq/L (ref 19–32)
Calcium: 9.3 mg/dL (ref 8.4–10.5)
Chloride: 101 mEq/L (ref 96–112)
Creatinine, Ser: 0.5 mg/dL (ref 0.4–1.2)
GFR: 132.12 mL/min (ref >60.00)
Glucose, Bld: 220 mg/dL — ABNORMAL HIGH (ref 70–99)
Potassium: 4.4 mEq/L (ref 3.5–5.1)
Sodium: 137 mEq/L (ref 135–145)

## 2011-06-01 NOTE — Assessment & Plan Note (Signed)
Symptoms have both typical and atypical features. They're somewhat chronic. Previous functional studies negative. Multiple risk factors including diabetes mellitus, hypertension, hyperlipidemia and family history. Feel definitive evaluation warranted. Plan outpatient cardiac catheterization. The risks and benefits including myocardial infarction, CVA and death discussed and patient agrees to proceed. Continue aspirin and statin.

## 2011-06-01 NOTE — Progress Notes (Signed)
 HPI: Pleasant female for evaluation of chest pain. Pt had a Myoview in April of 2007 that showed normal LV function and normal perfusion. I saw her in January of 2011 for chest pain. A stress echocardiogram was ordered and performed in February of 2011 and was normal. Patient has had intermittent chest pain since the late 90s. They have worsened recently. It is in the left breast area and radiates to her left upper extremity. It is not pleuritic, positional or related to food. Typically increases with stress. Not clearly exertional. No associated shortness of breath or, nausea or diaphoresis. Patient had a more severe episode 2 days ago. She describes it as a pressure. She also has some dyspnea on exertion. Because of the above we were asked to reassess.  Current Outpatient Prescriptions  Medication Sig Dispense Refill  . aspirin 81 MG tablet Take 81 mg by mouth daily.        . buPROPion (WELLBUTRIN XL) 150 MG 24 hr tablet Take 150 mg by mouth daily.      . insulin glargine (LANTUS) 100 UNIT/ML injection 65 units sq qhs  30 mL  6  . insulin lispro (HUMALOG) 100 UNIT/ML injection 30-40 Units 3 (three) times daily before meals. Inject 15 units subcutaneously before meals      . lisinopril (PRINIVIL,ZESTRIL) 20 MG tablet Take 1 tablet (20 mg total) by mouth daily.  30 tablet  5  . Multiple Vitamin (MULTIVITAMIN) tablet Take 1 tablet by mouth daily.        . nitroGLYCERIN (NITROSTAT) 0.4 MG SL tablet Place 1 tablet (0.4 mg total) under the tongue every 5 (five) minutes as needed for chest pain.  10 tablet  0  . ONE TOUCH ULTRA TEST test strip TEST 3 TIMES A DAY AS DIRECTED -- DIAGNOSIS 250.02  100 each  2  . Pitavastatin Calcium (LIVALO) 4 MG TABS Take 4 mg by mouth daily.         Past Medical History  Diagnosis Date  . Hyperlipidemia   . Hypertension   . Diabetes mellitus     type 2 uncontrolled  . Depression   . Inflammatory bowel disease   . Head ache     daily  . History of DVT (deep  vein thrombosis) 2000  . Chronic back pain     Past Surgical History  Procedure Date  . Spine surgery 03/2008    Dr Tooke  . Oophorectomy 2008  . Abdominal hysterectomy 1990  . Pelvic laparoscopy     for endometriosis    History   Social History  . Marital Status: Married    Spouse Name: N/A    Number of Children: 2  . Years of Education: N/A   Occupational History  . [employer]    Social History Main Topics  . Smoking status: Never Smoker   . Smokeless tobacco: Never Used  . Alcohol Use: No  . Drug Use: No  . Sexually Active: Not on file   Other Topics Concern  . Not on file   Social History Narrative  . No narrative on file    ROS: no fevers or chills, productive cough, hemoptysis, dysphasia, odynophagia, melena, hematochezia, dysuria, hematuria, rash, seizure activity, orthopnea, PND, pedal edema, claudication. Remaining systems are negative.  Physical Exam: Well-developed well-nourished in no acute distress.  Skin is warm and dry.  HEENT is normal.  Neck is supple. No thyromegaly.  Chest is clear to auscultation with normal expansion.  Cardiovascular exam   is regular rate and rhythm.  Abdominal exam nontender or distended. No masses palpated. Extremities show no edema. neuro grossly intact  ECG 05/27/2011-sinus rhythm with no ST changes. Sinus rhythm at a rate of 83. No ST changes.     

## 2011-06-01 NOTE — Assessment & Plan Note (Signed)
Blood pressure mildly elevated. We will follow this and increase medications as needed. 

## 2011-06-01 NOTE — Assessment & Plan Note (Signed)
Continue statin. 

## 2011-06-01 NOTE — Patient Instructions (Signed)
The current medical regimen is effective;  continue present plan and medications.  Your physician has requested that you have a cardiac catheterization. Cardiac catheterization is used to diagnose and/or treat various heart conditions. Doctors may recommend this procedure for a number of different reasons. The most common reason is to evaluate chest pain. Chest pain can be a symptom of coronary artery disease (CAD), and cardiac catheterization can show whether plaque is narrowing or blocking your heart's arteries. This procedure is also used to evaluate the valves, as well as measure the blood flow and oxygen levels in different parts of your heart. For further information please visit [email or web address] Please follow instruction sheet, as given.  FOLLOW UP AFTER CARDIAC CATH.

## 2011-06-02 ENCOUNTER — Encounter (HOSPITAL_BASED_OUTPATIENT_CLINIC_OR_DEPARTMENT_OTHER)
Admission: RE | Disposition: A | Discharge status: Home / Self Care | Payer: Self-pay | Source: Ambulatory Visit | Attending: Cardiovascular Disease

## 2011-06-02 ENCOUNTER — Encounter (HOSPITAL_BASED_OUTPATIENT_CLINIC_OR_DEPARTMENT_OTHER): Payer: Self-pay | Admitting: Cardiovascular Disease

## 2011-06-02 ENCOUNTER — Inpatient Hospital Stay (HOSPITAL_BASED_OUTPATIENT_CLINIC_OR_DEPARTMENT_OTHER)
Admission: RE | Admit: 2011-06-02 | Discharge: 2011-06-02 | Disposition: A | Discharge status: Home / Self Care | Payer: 59 | Source: Ambulatory Visit | Attending: Cardiovascular Disease | Admitting: Cardiovascular Disease

## 2011-06-02 DIAGNOSIS — Z86718 Personal history of other venous thrombosis and embolism: Secondary | ICD-10-CM | POA: Insufficient documentation

## 2011-06-02 DIAGNOSIS — E119 Type 2 diabetes mellitus without complications: Secondary | ICD-10-CM | POA: Insufficient documentation

## 2011-06-02 DIAGNOSIS — F329 Major depressive disorder, single episode, unspecified: Secondary | ICD-10-CM | POA: Insufficient documentation

## 2011-06-02 DIAGNOSIS — R079 Chest pain, unspecified: Secondary | ICD-10-CM

## 2011-06-02 DIAGNOSIS — F3289 Other specified depressive episodes: Secondary | ICD-10-CM | POA: Insufficient documentation

## 2011-06-02 DIAGNOSIS — I10 Essential (primary) hypertension: Secondary | ICD-10-CM | POA: Insufficient documentation

## 2011-06-02 LAB — POCT I-STAT GLUCOSE
Glucose, Bld: 207 mg/dL — ABNORMAL HIGH (ref 70–99)
Operator id: 221371

## 2011-06-02 SURGERY — JV LEFT HEART CATHETERIZATION WITH CORONARY ANGIOGRAM
Anesthesia: Moderate Sedation

## 2011-06-02 MED ORDER — ONDANSETRON HCL 4 MG/2ML IJ SOLN: 4.0000 mg | Freq: Four times a day (QID) | INTRAMUSCULAR | Status: DC | PRN

## 2011-06-02 MED ORDER — SODIUM CHLORIDE 0.9 % IJ SOLN: 3.0000 mL | INTRAMUSCULAR | Status: DC | PRN

## 2011-06-02 MED ORDER — DIAZEPAM 5 MG PO TABS
5.0000 mg | ORAL_TABLET | Freq: Once | ORAL | Status: AC
  Administered 2011-06-02: 5 mg via ORAL

## 2011-06-02 MED ORDER — SODIUM CHLORIDE 0.9 % IV SOLN
1.0000 mL/kg/h | INTRAVENOUS | Status: DC
  Administered 2011-06-02: 1 mL/kg/h via INTRAVENOUS

## 2011-06-02 MED ORDER — SODIUM CHLORIDE 0.9 % IJ SOLN: 3.0000 mL | Freq: Two times a day (BID) | INTRAMUSCULAR | Status: DC

## 2011-06-02 MED ORDER — ASPIRIN 81 MG PO CHEW
324.0000 mg | CHEWABLE_TABLET | ORAL | Status: AC
  Administered 2011-06-02: 324 mg via ORAL

## 2011-06-02 MED ORDER — SODIUM CHLORIDE 0.9 % IV SOLN: INTRAVENOUS | Status: AC

## 2011-06-02 MED ORDER — DIAZEPAM 2 MG PO TABS: 2.0000 mg | ORAL_TABLET | ORAL | Status: DC

## 2011-06-02 MED ORDER — ACETAMINOPHEN 325 MG PO TABS: 650.0000 mg | ORAL_TABLET | ORAL | Status: DC | PRN

## 2011-06-02 MED ORDER — SODIUM CHLORIDE 0.9 % IV SOLN: 250.0000 mL | INTRAVENOUS | Status: DC | PRN

## 2011-06-02 NOTE — Interval H&P Note (Signed)
History and Physical Interval Note:  06/02/2011 8:34 AM  [name] [name]  has presented today for surgery, with the diagnosis of chest pain  The various methods of treatment have been discussed with the patient and family. After consideration of risks, benefits and other options for treatment, the patient has consented to  Procedure(s) (LRB): JV LEFT HEART CATHETERIZATION WITH CORONARY ANGIOGRAM (N/A) as a surgical intervention .  The patients' history has been reviewed, patient examined, no change in status, stable for surgery.  I have reviewed the patients' chart and labs.  Questions were answered to the patient's satisfaction.     [name].

## 2011-06-02 NOTE — Progress Notes (Signed)
Bedrest begins @ [phone].

## 2011-06-02 NOTE — H&P (View-Only) (Signed)
HPI: Pleasant female for evaluation of chest pain. Pt had a Myoview in April of 2007 that showed normal LV function and normal perfusion. I saw her in January of 2011 for chest pain. A stress echocardiogram was ordered and performed in February of 2011 and was normal. Patient has had intermittent chest pain since the late 90s. They have worsened recently. It is in the left breast area and radiates to her left upper extremity. It is not pleuritic, positional or related to food. Typically increases with stress. Not clearly exertional. No associated shortness of breath or, nausea or diaphoresis. Patient had a more severe episode 2 days ago. She describes it as a pressure. She also has some dyspnea on exertion. Because of the above we were asked to reassess.  Current Outpatient Prescriptions  Medication Sig Dispense Refill  . aspirin 81 MG tablet Take 81 mg by mouth daily.        Marland Kitchen buPROPion (WELLBUTRIN XL) 150 MG 24 hr tablet Take 150 mg by mouth daily.      . insulin glargine (LANTUS) 100 UNIT/ML injection 65 units sq qhs  30 mL  6  . insulin lispro (HUMALOG) 100 UNIT/ML injection 30-40 Units 3 (three) times daily before meals. Inject 15 units subcutaneously before meals      . lisinopril (PRINIVIL,ZESTRIL) 20 MG tablet Take 1 tablet (20 mg total) by mouth daily.  30 tablet  5  . Multiple Vitamin (MULTIVITAMIN) tablet Take 1 tablet by mouth daily.        . nitroGLYCERIN (NITROSTAT) 0.4 MG SL tablet Place 1 tablet (0.4 mg total) under the tongue every 5 (five) minutes as needed for chest pain.  10 tablet  0  . ONE TOUCH ULTRA TEST test strip TEST 3 TIMES A DAY AS DIRECTED -- DIAGNOSIS 250.02  100 each  2  . Pitavastatin Calcium (LIVALO) 4 MG TABS Take 4 mg by mouth daily.         Past Medical History  Diagnosis Date  . Hyperlipidemia   . Hypertension   . Diabetes mellitus     type 2 uncontrolled  . Depression   . Inflammatory bowel disease   . Head ache     daily  . History of DVT (deep  vein thrombosis) 2000  . Chronic back pain     Past Surgical History  Procedure Date  . Spine surgery 03/2008    Dr Alveda Reasons  . Oophorectomy 2008  . Abdominal hysterectomy 1990  . Pelvic laparoscopy     for endometriosis    History   Social History  . Marital Status: Married    Spouse Name: N/A    Number of Children: 2  . Years of Education: N/A   Occupational History  . [employer]    Social History Main Topics  . Smoking status: Never Smoker   . Smokeless tobacco: Never Used  . Alcohol Use: No  . Drug Use: No  . Sexually Active: Not on file   Other Topics Concern  . Not on file   Social History Narrative  . No narrative on file    ROS: no fevers or chills, productive cough, hemoptysis, dysphasia, odynophagia, melena, hematochezia, dysuria, hematuria, rash, seizure activity, orthopnea, PND, pedal edema, claudication. Remaining systems are negative.  Physical Exam: Well-developed well-nourished in no acute distress.  Skin is warm and dry.  HEENT is normal.  Neck is supple. No thyromegaly.  Chest is clear to auscultation with normal expansion.  Cardiovascular exam  is regular rate and rhythm.  Abdominal exam nontender or distended. No masses palpated. Extremities show no edema. neuro grossly intact  ECG 05/27/2011-sinus rhythm with no ST changes. Sinus rhythm at a rate of 83. No ST changes.

## 2011-06-02 NOTE — Progress Notes (Signed)
Discharge instructions completed, ambulated to bathroom without bleeding from right groin site.  Discharged to home via wheelchair with daughter and husband.

## 2011-06-02 NOTE — Op Note (Signed)
    Cardiac Cath Note  [name] [name] [id number] 1965/10/02  Procedure: Left Heart Cardiac Catheterization Note Indications: chest pain  Procedure Details Consent: Obtained Time Out: Verified patient identification, verified procedure, site/side was marked, verified correct patient position, special equipment/implants available, Radiology Safety Procedures followed,  medications/allergies/relevent history reviewed, required imaging and test results available.  Performed   Medications: Fentanyl: 50 mcg IV Versed: 2 mg IV  The right femoral artery was easily canulated using a modified Seldinger technique.   LV pressure: 146/25 Aortic pressure: 140/78  Angiography   Left Main: smooth and normal  Left anterior Descending: smooth and normal.  There is a large diagonal that is normal.  Left Circumflex: Smooth and normal.  There is a large obtuse marginal that is smooth and normal  Right Coronary Artery: Large and dominant.  Smooth and normal.  The PDA is normal  LV Gram: normal LV function. EF 65 %.  Complications: No apparent complications Patient did tolerate procedure well.  Conclusions:   1. Smooth and normal coronary arteries 2. Normal LV function.  [name], Montez Hageman., MD, [facility] 06/02/2011, 9:15 AM

## 2011-06-09 ENCOUNTER — Institutional Professional Consult (permissible substitution): Payer: 59 | Admitting: Cardiology

## 2011-06-10 ENCOUNTER — Ambulatory Visit: Payer: 59 | Admitting: Internal Medicine

## 2011-06-14 ENCOUNTER — Encounter: Payer: 59 | Admitting: Nurse Practitioner

## 2011-06-17 ENCOUNTER — Ambulatory Visit (INDEPENDENT_AMBULATORY_CARE_PROVIDER_SITE_OTHER): Payer: 59 | Admitting: Internal Medicine

## 2011-06-17 ENCOUNTER — Encounter: Payer: Self-pay | Admitting: Internal Medicine

## 2011-06-17 VITALS — BP 122/80 | HR 80 | Temp 97.9°F | Ht 67.0 in | Wt 217.0 lb

## 2011-06-17 DIAGNOSIS — R079 Chest pain, unspecified: Secondary | ICD-10-CM

## 2011-06-17 DIAGNOSIS — IMO0001 Reserved for inherently not codable concepts without codable children: Secondary | ICD-10-CM

## 2011-06-17 DIAGNOSIS — E785 Hyperlipidemia, unspecified: Secondary | ICD-10-CM

## 2011-06-17 MED ORDER — LISINOPRIL 20 MG PO TABS: 20.0000 mg | ORAL_TABLET | Freq: Every day | ORAL | Status: DC

## 2011-06-17 MED ORDER — INSULIN LISPRO 100 UNIT/ML ~~LOC~~ SOLN: 30.0000 [IU] | Freq: Three times a day (TID) | SUBCUTANEOUS | Status: DC

## 2011-06-17 MED ORDER — PITAVASTATIN CALCIUM 4 MG PO TABS: 4.0000 mg | ORAL_TABLET | Freq: Every day | ORAL | Status: DC

## 2011-06-17 MED ORDER — INSULIN GLARGINE 100 UNIT/ML ~~LOC~~ SOLN: 75.0000 [IU] | Freq: Every day | SUBCUTANEOUS | Status: DC

## 2011-06-17 MED ORDER — OMEPRAZOLE 40 MG PO CPDR: 40.0000 mg | DELAYED_RELEASE_CAPSULE | Freq: Every day | ORAL | Status: DC

## 2011-06-17 NOTE — Progress Notes (Signed)
  Subjective:    Patient ID: [name] [name], female    DOB: January 30, 1966, 46 y.o.   MRN: [id number]  HPI Pt presents to clinic for followup of multiple medical problems. Last visit referred to cardiology for evaluation of cp. Now s/p recent nl cardiac cath. Has intermittent cp still-not positional/reproducible and not associated with gerd sx's. Now has begun exercising and reducing sugars/carbs. Last 3 mornings fasting fsbs 90's without hypoglycemia. Losing wt with exercise.   Past Medical History  Diagnosis Date  . Hyperlipidemia   . Hypertension   . Diabetes mellitus     type 2 uncontrolled  . Depression   . Inflammatory bowel disease   . Head ache     daily  . History of DVT (deep vein thrombosis) 2000  . Chronic back pain    Past Surgical History  Procedure Date  . Spine surgery 03/2008    Dr Alveda Reasons  . Oophorectomy 2008  . Abdominal hysterectomy 1990  . Pelvic laparoscopy     for endometriosis    reports that she has never smoked. She has never used smokeless tobacco. She reports that she does not drink alcohol or use illicit drugs. family history includes Alcohol abuse in her other; Arthritis in her other; Cancer in her other; Diabetes in her other; Heart disease in her other; Hyperlipidemia in her other; Hypertension in her other; Kidney disease in her other; and Stroke in her other. No Known Allergies     Review of Systems see hpi    Objective:   Physical Exam  Nursing note and vitals reviewed. Constitutional: She appears well-developed and well-nourished. No distress.  Skin: She is not diaphoretic.          Assessment & Plan:

## 2011-06-17 NOTE — Assessment & Plan Note (Signed)
Low fat diet/exercise/wt loss. Samples of livalo provided. Recheck lipid/lft prior to next visit

## 2011-06-17 NOTE — Patient Instructions (Signed)
Please schedule fasting labs  Chem7, a1c-250.0 and lipid/lft-272.4 prior to next visit

## 2011-06-17 NOTE — Assessment & Plan Note (Signed)
S/p recent nl cardiac cath. Begin daily ppi

## 2011-06-17 NOTE — Assessment & Plan Note (Signed)
Improving control. Continue same lantus dosing. Reviewed humalog dosing instructions. Focus on diabetic diet, exercise and wt loss. Obtain chem7, a1c prior to next visit

## 2011-06-18 ENCOUNTER — Encounter: Payer: Self-pay | Admitting: Gastroenterology

## 2011-06-18 ENCOUNTER — Telehealth: Payer: Self-pay | Admitting: *Deleted

## 2011-06-18 NOTE — Telephone Encounter (Signed)
V16.0, v12.72... Per Dr Jarold Motto patient due for recall colonoscopy, called patient and scheduled her for colon on 07/21/2011 and pre visit on 07/12/2011

## 2011-06-18 NOTE — Telephone Encounter (Signed)
Message copied by Leonette Monarch on Fri Jun 18, 2011 12:29 PM ------      Message from: Mardella Layman      Created: Fri Jun 18, 2011  8:22 AM                   ----- Message -----         From: Edwyna Perfect, MD         Sent: 06/17/2011   5:29 PM           To: Mardella Layman, MD            Just a quick question. Do you have a record of Ms. Berrey needing q 61month colonoscopies? She tells me she was told years ago would need that close a follow up and saw a geneticist as well. i found an epic note for post polypectomy bleeding from 2007 but nothing more specific.      Thanks      W

## 2011-07-12 ENCOUNTER — Encounter: Payer: Self-pay | Admitting: Gastroenterology

## 2011-07-12 ENCOUNTER — Ambulatory Visit (AMBULATORY_SURGERY_CENTER): Payer: 59 | Admitting: *Deleted

## 2011-07-12 VITALS — Ht 67.0 in | Wt 223.4 lb

## 2011-07-12 DIAGNOSIS — Z1211 Encounter for screening for malignant neoplasm of colon: Secondary | ICD-10-CM

## 2011-07-12 MED ORDER — PEG-KCL-NACL-NASULF-NA ASC-C 100 G PO SOLR: ORAL | Status: DC

## 2011-07-21 ENCOUNTER — Encounter: Payer: Self-pay | Admitting: Gastroenterology

## 2011-07-21 ENCOUNTER — Ambulatory Visit (AMBULATORY_SURGERY_CENTER): Payer: 59 | Admitting: Gastroenterology

## 2011-07-21 VITALS — BP 131/79 | HR 78 | Temp 97.5°F | Resp 18 | Ht 67.0 in | Wt 217.0 lb

## 2011-07-21 DIAGNOSIS — Z800 Family history of malignant neoplasm of digestive organs: Secondary | ICD-10-CM

## 2011-07-21 DIAGNOSIS — D175 Benign lipomatous neoplasm of intra-abdominal organs: Secondary | ICD-10-CM

## 2011-07-21 DIAGNOSIS — Z1211 Encounter for screening for malignant neoplasm of colon: Secondary | ICD-10-CM

## 2011-07-21 LAB — GLUCOSE, CAPILLARY: Glucose-Capillary: 229 mg/dL — ABNORMAL HIGH (ref 70–99)

## 2011-07-21 MED ORDER — SODIUM CHLORIDE 0.9 % IV SOLN: 500.0000 mL | INTRAVENOUS | Status: DC

## 2011-07-21 NOTE — Progress Notes (Signed)
Patient did not experience any of the following events: a burn prior to discharge; a fall within the facility; wrong site/side/patient/procedure/implant event; or a hospital transfer or hospital admission upon discharge from the facility. (G8907) Patient did not have preoperative order for IV antibiotic SSI prophylaxis. (G8918)  

## 2011-07-21 NOTE — Patient Instructions (Signed)
YOU HAD AN ENDOSCOPIC PROCEDURE TODAY AT THE [facility]: Refer to the procedure report that was given to you for any specific questions about what was found during the examination.  If the procedure report does not answer your questions, please call your gastroenterologist to clarify.  If you requested that your care partner not be given the details of your procedure findings, then the procedure report has been included in a sealed envelope for you to review at your convenience later.  YOU SHOULD EXPECT: Some feelings of bloating in the abdomen. Passage of more gas than usual.  Walking can help get rid of the air that was put into your GI tract during the procedure and reduce the bloating. If you had a lower endoscopy (such as a colonoscopy or flexible sigmoidoscopy) you may notice spotting of blood in your stool or on the toilet paper. If you underwent a bowel prep for your procedure, then you may not have a normal bowel movement for a few days.  DIET: Your first meal following the procedure should be a light meal and then it is ok to progress to your normal diet.  A half-sandwich or bowl of soup is an example of a good first meal.  Heavy or fried foods are harder to digest and may make you feel nauseous or bloated.  Likewise meals heavy in dairy and vegetables can cause extra gas to form and this can also increase the bloating.  Drink plenty of fluids but you should avoid alcoholic beverages for 24 hours.  ACTIVITY: Your care partner should take you home directly after the procedure.  You should plan to take it easy, moving slowly for the rest of the day.  You can resume normal activity the day after the procedure however you should NOT DRIVE or use heavy machinery for 24 hours (because of the sedation medicines used during the test).    SYMPTOMS TO REPORT IMMEDIATELY: A gastroenterologist can be reached at any hour.  During normal business hours, 8:30 AM to 5:00 PM Monday through Friday,  call [phone].  After hours and on weekends, please call the GI answering service at [phone] who will take a message and have the physician on call contact you.   Following lower endoscopy (colonoscopy or flexible sigmoidoscopy):  Excessive amounts of blood in the stool  Significant tenderness or worsening of abdominal pains  Swelling of the abdomen that is new, acute  Fever of 100F or higher  Following upper endoscopy (EGD)  Vomiting of blood or coffee ground material  New chest pain or pain under the shoulder blades  Painful or persistently difficult swallowing  New shortness of breath  Fever of 100F or higher  Black, tarry-looking stools  FOLLOW UP: If any biopsies were taken you will be contacted by phone or by letter within the next 1-3 weeks.  Call your gastroenterologist if you have not heard about the biopsies in 3 weeks.  Our staff will call the home number listed on your records the next business day following your procedure to check on you and address any questions or concerns that you may have at that time regarding the information given to you following your procedure. This is a courtesy call and so if there is no answer at the home number and we have not heard from you through the emergency physician on call, we will assume that you have returned to your regular daily activities without incident.  SIGNATURES/CONFIDENTIALITY: You and/or your care   partner have signed paperwork which will be entered into your electronic medical record.  These signatures attest to the fact that that the information above on your After Visit Summary has been reviewed and is understood.  Full responsibility of the confidentiality of this discharge information lies with you and/or your care-partner.  

## 2011-07-21 NOTE — Op Note (Signed)
[facility] [address] [place], [place]  [address]  COLONOSCOPY PROCEDURE REPORT  PATIENT:  [name] [name], [name] [name]  MR#:  [id number] BIRTHDATE:  Aug 01, 1965, 45 yrs. old  GENDER:  female ENDOSCOPIST:  Vania Rea. Jarold Motto, MD, [facility] REF. BY:  Charlynn Court, M.D. PROCEDURE DATE:  07/21/2011 PROCEDURE:  Higher-risk screening colonoscopy G0105  ASA CLASS:  Class II INDICATIONS:  FH COLON CANCER MEDICATIONS:   propofol (Diprivan) 150 mg IV  DESCRIPTION OF PROCEDURE:   After the risks and benefits and of the procedure were explained, informed consent was obtained. Digital rectal exam was performed and revealed perianal skin tags. The LB [id number]-[id number] endoscope was introduced through the anus and advanced to the cecum, which was identified by both the appendix and ileocecal valve.  The quality of the prep was excellent, using MoviPrep.  The instrument was then slowly withdrawn as the colon was fully examined. <<PROCEDUREIMAGES>>  FINDINGS:  ULTRASONIC FINDINGS:  A lipoma was found in the ascending colon.  CM LIPOMA NOTED.SEE PICTURES. No polyps or cancers were seen.  This was otherwise a normal examination of the colon.   Retroflexed views in the rectum revealed hypertrophied anal papillae.    The scope was then withdrawn from the patient and the procedure completed.  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 Jarold Motto, MD, [facility]  CC:  n. eSIGNED:   Vania Rea. Gurveer Colucci at 07/21/2011 11:06 AM  [place], [place], [phone]

## 2011-07-22 ENCOUNTER — Telehealth: Payer: Self-pay | Admitting: *Deleted

## 2011-07-22 LAB — GLUCOSE, CAPILLARY: Glucose-Capillary: 205 mg/dL — ABNORMAL HIGH (ref 70–99)

## 2011-07-22 NOTE — Telephone Encounter (Signed)
Left message on f/u callback 

## 2011-09-13 ENCOUNTER — Other Ambulatory Visit: Payer: Self-pay | Admitting: *Deleted

## 2011-09-13 DIAGNOSIS — E785 Hyperlipidemia, unspecified: Secondary | ICD-10-CM

## 2011-09-13 DIAGNOSIS — IMO0001 Reserved for inherently not codable concepts without codable children: Secondary | ICD-10-CM

## 2011-09-13 LAB — HEMOGLOBIN A1C
Hgb A1c MFr Bld: 11.8 % — ABNORMAL HIGH (ref <5.7)
Mean Plasma Glucose: 292 mg/dL — ABNORMAL HIGH (ref <117)

## 2011-09-13 NOTE — Progress Notes (Signed)
Future labs ordered, printed & released for lab/SLS

## 2011-09-14 LAB — BASIC METABOLIC PANEL
BUN: 14 mg/dL (ref 6–23)
CO2: 28 mEq/L (ref 19–32)
Calcium: 10.4 mg/dL (ref 8.4–10.5)
Chloride: 102 mEq/L (ref 96–112)
Creat: 0.69 mg/dL (ref 0.50–1.10)
Glucose, Bld: 122 mg/dL — ABNORMAL HIGH (ref 70–99)
Potassium: 4.2 mEq/L (ref 3.5–5.3)
Sodium: 139 mEq/L (ref 135–145)

## 2011-09-14 LAB — HEPATIC FUNCTION PANEL
ALT: 38 U/L — ABNORMAL HIGH (ref 0–35)
AST: 24 U/L (ref 0–37)
Albumin: 4.4 g/dL (ref 3.5–5.2)
Alkaline Phosphatase: 86 U/L (ref 39–117)
Bilirubin, Direct: 0.1 mg/dL (ref 0.0–0.3)
Indirect Bilirubin: 0.3 mg/dL (ref 0.0–0.9)
Total Bilirubin: 0.4 mg/dL (ref 0.3–1.2)
Total Protein: 7.2 g/dL (ref 6.0–8.3)

## 2011-09-14 LAB — LIPID PANEL
Cholesterol: 207 mg/dL — ABNORMAL HIGH (ref 0–200)
HDL: 39 mg/dL — ABNORMAL LOW (ref >39)
LDL Cholesterol: 119 mg/dL — ABNORMAL HIGH (ref 0–99)
Total CHOL/HDL Ratio: 5.3 Ratio
Triglycerides: 247 mg/dL — ABNORMAL HIGH (ref <150)
VLDL: 49 mg/dL — ABNORMAL HIGH (ref 0–40)

## 2011-09-16 ENCOUNTER — Ambulatory Visit (INDEPENDENT_AMBULATORY_CARE_PROVIDER_SITE_OTHER): Payer: 59 | Admitting: Internal Medicine

## 2011-09-16 ENCOUNTER — Encounter: Payer: Self-pay | Admitting: Internal Medicine

## 2011-09-16 VITALS — BP 116/80 | HR 76 | Temp 97.1°F | Resp 16 | Ht 67.0 in | Wt 220.0 lb

## 2011-09-16 DIAGNOSIS — IMO0001 Reserved for inherently not codable concepts without codable children: Secondary | ICD-10-CM

## 2011-09-16 DIAGNOSIS — H9209 Otalgia, unspecified ear: Secondary | ICD-10-CM

## 2011-09-16 DIAGNOSIS — I10 Essential (primary) hypertension: Secondary | ICD-10-CM

## 2011-09-16 DIAGNOSIS — IMO0002 Reserved for concepts with insufficient information to code with codable children: Secondary | ICD-10-CM

## 2011-09-16 DIAGNOSIS — E1165 Type 2 diabetes mellitus with hyperglycemia: Secondary | ICD-10-CM

## 2011-09-16 MED ORDER — FLUTICASONE PROPIONATE 50 MCG/ACT NA SUSP: 2.0000 | Freq: Every day | NASAL | Status: DC

## 2011-09-26 DIAGNOSIS — H9209 Otalgia, unspecified ear: Secondary | ICD-10-CM | POA: Insufficient documentation

## 2011-09-26 NOTE — Progress Notes (Signed)
  Subjective:    Patient ID: [name] [name], female    DOB: 1966-01-19, 46 y.o.   MRN: [id number]  HPI Pt presents to clinic for followup of multiple medical problems. Notes left ear fullness with scratchy throat in am. No f/c, cough or sick exposure. Reviewed diabetic control worsening now a1c 11.8. BP reviewed normotensive.   Past Medical History  Diagnosis Date  . Hyperlipidemia   . Hypertension   . Diabetes mellitus     type 2 uncontrolled  . Depression   . Inflammatory bowel disease   . Head ache     daily  . History of DVT (deep vein thrombosis) 2000  . Chronic back pain    Past Surgical History  Procedure Date  . Spine surgery 03/2008    Dr Alveda Reasons  . Oophorectomy 2008  . Abdominal hysterectomy 1990  . Pelvic laparoscopy     for endometriosis    reports that she has never smoked. She has never used smokeless tobacco. She reports that she does not drink alcohol or use illicit drugs. family history includes Alcohol abuse in her other; Arthritis in her other; Cancer in her other; Colon cancer in her maternal aunt, maternal uncle, and paternal grandfather; Diabetes in her other; Heart disease in her other; Hyperlipidemia in her other; Hypertension in her other; Kidney disease in her other; and Stroke in her other. No Known Allergies    Review of Systems see hpi     Objective:   Physical Exam  Physical Exam  Nursing note and vitals reviewed. Constitutional: Appears well-developed and well-nourished. No distress.  HENT:  Head: Normocephalic and atraumatic.  Right Ear: External ear normal.  Left Ear: External ear normal.  Eyes: Conjunctivae are normal. No scleral icterus.  Neck: Neck supple. Carotid bruit is not present.  Cardiovascular: Normal rate, regular rhythm and normal heart sounds.  Exam reveals no gallop and no friction rub.   No murmur heard. Pulmonary/Chest: Effort normal and breath sounds normal. No respiratory distress. He has no wheezes. no rales.    Lymphadenopathy:    He has no cervical adenopathy.  Neurological:Alert.  Skin: Skin is warm and dry. Not diaphoretic.  Psychiatric: Has a normal mood and affect.        Assessment & Plan:

## 2011-09-26 NOTE — Assessment & Plan Note (Signed)
Poor control. Proceed with endocrinology consult

## 2011-09-26 NOTE — Assessment & Plan Note (Signed)
Normotensive and stable. Continue current regimen. Monitor bp as outpt and followup in clinic as scheduled.  

## 2011-09-26 NOTE — Assessment & Plan Note (Signed)
No evidence of infection. Attempt flonase. Followup if no improvement or worsening.

## 2012-08-31 ENCOUNTER — Other Ambulatory Visit: Payer: Self-pay

## 2012-11-22 ENCOUNTER — Encounter: Payer: Self-pay | Admitting: Internal Medicine

## 2013-05-09 ENCOUNTER — Encounter: Payer: Self-pay | Admitting: Physician Assistant

## 2013-05-09 ENCOUNTER — Ambulatory Visit (INDEPENDENT_AMBULATORY_CARE_PROVIDER_SITE_OTHER): Payer: 59 | Admitting: Physician Assistant

## 2013-05-09 VITALS — BP 142/92 | HR 92 | Temp 98.3°F | Resp 16 | Ht 67.0 in | Wt 227.5 lb

## 2013-05-09 DIAGNOSIS — E119 Type 2 diabetes mellitus without complications: Secondary | ICD-10-CM

## 2013-05-09 DIAGNOSIS — R740 Nonspecific elevation of levels of transaminase and lactic acid dehydrogenase [LDH]: Secondary | ICD-10-CM

## 2013-05-09 DIAGNOSIS — E1165 Type 2 diabetes mellitus with hyperglycemia: Secondary | ICD-10-CM

## 2013-05-09 DIAGNOSIS — R7402 Elevation of levels of lactic acid dehydrogenase (LDH): Secondary | ICD-10-CM

## 2013-05-09 DIAGNOSIS — I10 Essential (primary) hypertension: Secondary | ICD-10-CM

## 2013-05-09 DIAGNOSIS — R7401 Elevation of levels of liver transaminase levels: Secondary | ICD-10-CM

## 2013-05-09 DIAGNOSIS — IMO0001 Reserved for inherently not codable concepts without codable children: Secondary | ICD-10-CM

## 2013-05-09 DIAGNOSIS — J329 Chronic sinusitis, unspecified: Secondary | ICD-10-CM

## 2013-05-09 LAB — CBC WITH DIFFERENTIAL/PLATELET
Basophils Absolute: 0 10*3/uL (ref 0.0–0.1)
Basophils Relative: 0 % (ref 0–1)
Eosinophils Absolute: 0.2 10*3/uL (ref 0.0–0.7)
Eosinophils Relative: 2 % (ref 0–5)
HCT: 41.3 % (ref 36.0–46.0)
Hemoglobin: 13.8 g/dL (ref 12.0–15.0)
Lymphocytes Relative: 33 % (ref 12–46)
Lymphs Abs: 2.5 10*3/uL (ref 0.7–4.0)
MCH: 26.2 pg (ref 26.0–34.0)
MCHC: 33.4 g/dL (ref 30.0–36.0)
MCV: 78.4 fL (ref 78.0–100.0)
Monocytes Absolute: 0.5 10*3/uL (ref 0.1–1.0)
Monocytes Relative: 6 % (ref 3–12)
Neutro Abs: 4.4 10*3/uL (ref 1.7–7.7)
Neutrophils Relative %: 59 % (ref 43–77)
Platelets: 304 10*3/uL (ref 150–400)
RBC: 5.27 MIL/uL — ABNORMAL HIGH (ref 3.87–5.11)
RDW: 14.8 % (ref 11.5–15.5)
WBC: 7.5 10*3/uL (ref 4.0–10.5)

## 2013-05-09 LAB — BASIC METABOLIC PANEL
BUN: 16 mg/dL (ref 6–23)
CO2: 29 mEq/L (ref 19–32)
Calcium: 10.3 mg/dL (ref 8.4–10.5)
Chloride: 101 mEq/L (ref 96–112)
Creat: 0.59 mg/dL (ref 0.50–1.10)
Glucose, Bld: 148 mg/dL — ABNORMAL HIGH (ref 70–99)
Potassium: 3.9 mEq/L (ref 3.5–5.3)
Sodium: 138 mEq/L (ref 135–145)

## 2013-05-09 LAB — HEPATIC FUNCTION PANEL
ALT: 39 U/L — ABNORMAL HIGH (ref 0–35)
AST: 20 U/L (ref 0–37)
Albumin: 4.6 g/dL (ref 3.5–5.2)
Alkaline Phosphatase: 92 U/L (ref 39–117)
Bilirubin, Direct: 0.1 mg/dL (ref 0.0–0.3)
Indirect Bilirubin: 0.3 mg/dL (ref 0.2–1.2)
Total Bilirubin: 0.4 mg/dL (ref 0.2–1.2)
Total Protein: 7.9 g/dL (ref 6.0–8.3)

## 2013-05-09 LAB — HEMOGLOBIN A1C
Hgb A1c MFr Bld: 10 % — ABNORMAL HIGH (ref <5.7)
Mean Plasma Glucose: 240 mg/dL — ABNORMAL HIGH (ref <117)

## 2013-05-09 MED ORDER — AZITHROMYCIN 250 MG PO TABS: ORAL_TABLET | ORAL | Status: DC

## 2013-05-09 NOTE — Progress Notes (Signed)
Patient presents to clinic today to discuss lab results that were obtained recently from an [facility].  Patient was seen 04/24/13 at a [facility] for sinusitis.  Was told she had an elevated CBG and ketones in her urine.  Patient has a diagnosis of DM II, uncontrolled.  Patient has never been seen by me personally.  Was last seen here on 09/16/2011 by her former PCP, Dr. Elizebeth Koller. A1C was > 11.  Patient was just seen by her Endocrinologist and her insulin has been adjusted.  Patient was also told she had elevated liver enzymes and that they needed to be rechecked.  AST/ALT obtained show very mild elevations in liver function. Patient denies abdominal pain, jaundice or dark urine.  Denies hx of drug abuse.  Does endorse increased use of tylenol.  Patient c/o continued sinusitis symptoms -- sinus pressure, sinus pain, headache, PND and ear pain.  Endorses taking amoxicillin as prescribed.  Has completed medication.   Past Medical History  Diagnosis Date  . Hyperlipidemia   . Hypertension   . Diabetes mellitus     type 2 uncontrolled  . Depression   . Inflammatory bowel disease   . Head ache     daily  . History of DVT (deep vein thrombosis) 2000  . Chronic back pain     Current Outpatient Prescriptions on File Prior to Visit  Medication Sig Dispense Refill  . aspirin 81 MG tablet Take 81 mg by mouth daily.        Marland Kitchen lisinopril (PRINIVIL,ZESTRIL) 20 MG tablet Take 1 tablet (20 mg total) by mouth daily.  30 tablet  5  . Multiple Vitamin (MULTIVITAMIN) tablet Take 1 tablet by mouth daily.        . ONE TOUCH ULTRA TEST test strip TEST 3 TIMES A DAY AS DIRECTED -- DIAGNOSIS 250.02  100 each  2   No current facility-administered medications on file prior to visit.    No Known Allergies  Family History  Problem Relation Age of Onset  . Alcohol abuse Other   . Arthritis Other   . Cancer Other     breast, ovarian,prostate  . Heart disease Other     CAd  . Diabetes Other   .  Hyperlipidemia Other   . Hypertension Other   . Kidney disease Other   . Stroke Other   . Colon cancer Maternal Aunt   . Colon cancer Maternal Uncle   . Colon cancer Paternal Grandfather     History   Social History  . Marital Status: Married    Spouse Name: N/A    Number of Children: 2  . Years of Education: N/A   Occupational History  . [employer]    Social History Main Topics  . Smoking status: Never Smoker   . Smokeless tobacco: Never Used  . Alcohol Use: No  . Drug Use: No  . Sexual Activity: None   Other Topics Concern  . None   Social History Narrative  . None   Review of Systems - See HPI.  All other ROS are negative.  BP 142/92  Pulse 92  Temp(Src) 98.3 F (36.8 C) (Oral)  Resp 16  Ht 5\' 7"  (1.702 m)  Wt 227 lb 8 oz (103.193 kg)  BMI 35.62 kg/m2  SpO2 98%  Physical Exam  Constitutional: She is oriented to person, place, and time and well-developed, well-nourished, and in no distress.  HENT:  Head: Normocephalic and atraumatic.  Right Ear: External  ear normal.  Left Ear: External ear normal.  Nose: Nose normal.  Mouth/Throat: Oropharynx is clear and moist. No oropharyngeal exudate.  Fluid noted behind TM bilaterally.  + TTP of frontal sinuses noted on exam.  Eyes: Conjunctivae are normal. Pupils are equal, round, and reactive to light.  Neck: Neck supple.  Cardiovascular: Normal rate, regular rhythm, normal heart sounds and intact distal pulses.   Pulses:      Dorsalis pedis pulses are 2+ on the right side, and 2+ on the left side.       Posterior tibial pulses are 2+ on the right side, and 2+ on the left side.  Pulmonary/Chest: Effort normal and breath sounds normal. No respiratory distress. She has no wheezes. She has no rales. She exhibits no tenderness.  Lymphadenopathy:    She has no cervical adenopathy.  Neurological: She is alert and oriented to person, place, and time. She has normal sensation.  Skin: Skin is warm and dry. No rash  noted.  Psychiatric: Affect normal.   Assessment/Plan: Sinusitis Failed Amoxicillin.  Rx Azithromycin.  Do think sinusitis is cause of elevated blood sugar readings. Take antibiotic as prescribed.  Increase fluid intake.  Rest.  Saline nasal spray. Mucinex. Humidifier in bedroom.  Please call or return to clinic if symptoms are not improving.   HYPERTENSION BP elevated in clinic today.  Patient endorses taking Lisinopril as directed.  Patient has been taking Mucinex-D which can be cause of elevation.  Asymptomatic.  Stop Mucinex-D.  Continue medication.  Follow-up in 2 weeks for BP recheck.  DIABETES MELLITUS, TYPE II, UNCONTROLLED Diabetic foot exam completed and within normal limits.  Will recheck labs, including A1C and urine microalbumin.  Will refer patient to [facility] as she is very unhappy with [facility] but would like to continue seeing a specialist.  Elevated ALT measurement Very mild elevation noted at UC.  Patient was also told she was dehydrated.  Patient has avoided acetaminophen-containing products.  Will recheck hepatic panel.

## 2013-05-09 NOTE — Assessment & Plan Note (Signed)
Failed Amoxicillin.  Rx Azithromycin.  Do think sinusitis is cause of elevated blood sugar readings. Take antibiotic as prescribed.  Increase fluid intake.  Rest.  Saline nasal spray. Mucinex. Humidifier in bedroom.  Please call or return to clinic if symptoms are not improving.

## 2013-05-09 NOTE — Assessment & Plan Note (Signed)
BP elevated in clinic today.  Patient endorses taking Lisinopril as directed.  Patient has been taking Mucinex-D which can be cause of elevation.  Asymptomatic.  Stop Mucinex-D.  Continue medication.  Follow-up in 2 weeks for BP recheck.

## 2013-05-09 NOTE — Assessment & Plan Note (Signed)
Diabetic foot exam completed and within normal limits.  Will recheck labs, including A1C and urine microalbumin.  Will refer patient to [facility] Endocrinology as she is very unhappy with [facility] but would like to continue seeing a specialist.

## 2013-05-09 NOTE — Progress Notes (Signed)
Pre visit review using our clinic review tool, if applicable. No additional management support is needed unless otherwise documented below in the visit note/SLS  

## 2013-05-09 NOTE — Assessment & Plan Note (Signed)
Very mild elevation noted at [facility].  Patient was also told she was dehydrated.  Patient has avoided acetaminophen-containing products.  Will recheck hepatic panel.

## 2013-05-09 NOTE — Patient Instructions (Signed)
Please obtain labs.  I will call you with your results. Continue insulin as directed.  If blood sugars are above goal (AM - 100-130 and PM 150-180), you can increase that dose of insulin by 1 unit.  Take Z-pack for sinusitis.  Plain Mucinex.  Take Flonase as directed.  Place a humidifier in the bedroom. Return in 2 weeks for a BP recheck.  You will be contacted to set up an appointment with Endocrinology.

## 2013-05-10 ENCOUNTER — Telehealth: Payer: Self-pay | Admitting: *Deleted

## 2013-05-10 LAB — URINALYSIS, ROUTINE W REFLEX MICROSCOPIC
Bilirubin Urine: NEGATIVE
Glucose, UA: 250 mg/dL — AB
Hgb urine dipstick: NEGATIVE
Ketones, ur: NEGATIVE mg/dL
Leukocytes, UA: NEGATIVE
Nitrite: NEGATIVE
Protein, ur: NEGATIVE mg/dL
Specific Gravity, Urine: 1.021 (ref 1.005–1.030)
Urobilinogen, UA: 0.2 mg/dL (ref 0.0–1.0)
pH: 6 (ref 5.0–8.0)

## 2013-05-10 LAB — MICROALBUMIN / CREATININE URINE RATIO
Creatinine, Urine: 127.6 mg/dL
Microalb Creat Ratio: 9.5 mg/g (ref 0.0–30.0)
Microalb, Ur: 1.21 mg/dL (ref 0.00–1.89)

## 2013-05-17 ENCOUNTER — Ambulatory Visit: Payer: 59 | Admitting: Endocrinology

## 2013-05-17 ENCOUNTER — Telehealth: Payer: Self-pay

## 2013-05-17 NOTE — Telephone Encounter (Signed)
Relevant patient education mailed to patient.  

## 2013-05-23 ENCOUNTER — Ambulatory Visit (INDEPENDENT_AMBULATORY_CARE_PROVIDER_SITE_OTHER): Payer: 59 | Admitting: Physician Assistant

## 2013-05-23 ENCOUNTER — Encounter: Payer: Self-pay | Admitting: Physician Assistant

## 2013-05-23 VITALS — BP 126/88 | HR 79 | Temp 98.4°F | Resp 16 | Ht 67.0 in | Wt 223.5 lb

## 2013-05-23 DIAGNOSIS — I10 Essential (primary) hypertension: Secondary | ICD-10-CM

## 2013-05-23 NOTE — Assessment & Plan Note (Signed)
BP improved.  Continue Lisinopril 20 mg daily.  DASH diet handout given.  Continue diet/exercise.  Follow-up in 6 months.

## 2013-05-23 NOTE — Patient Instructions (Signed)
Your blood pressure is much improved.  Please continue the Lisinopril as directed.  Make sure to follow-up with Endocrinology and Ophthalmology as directed.  Please return in 6 months for follow-up.  Return sooner if you need anything.  It was a pleasure participating in your care today.  Hypertension Hypertension is another name for high blood pressure. High blood pressure may mean that your heart needs to work harder to pump blood. Blood pressure consists of two numbers, which includes a higher number over a lower number (example: 110/72). HOME CARE   Make lifestyle changes as told by your doctor. This may include weight loss and exercise.  Take your blood pressure medicine every day.  Limit how much salt you use.  Stop smoking if you smoke.  Do not use drugs.  Talk to your doctor if you are using decongestants or birth control pills. These medicines might make blood pressure higher.  Females should not drink more than 1 alcoholic drink per day. Males should not drink more than 2 alcoholic drinks per day.  See your doctor as told. GET HELP RIGHT AWAY IF:   You have a blood pressure reading with a top number of 180 or higher.  You get a very bad headache.  You get blurred or changing vision.  You feel confused.  You feel weak, numb, or faint.  You get chest or belly (abdominal) pain.  You throw up (vomit).  You cannot breathe very well. MAKE SURE YOU:   Understand these instructions.  Will watch your condition.  Will get help right away if you are not doing well or get worse. Document Released: 07/28/2007 Document Revised: 05/03/2011 Document Reviewed: 07/28/2007 [facility] Patient Information 2014 [place], [place].

## 2013-05-23 NOTE — Progress Notes (Signed)
Patient presents to clinic today for BP recheck. Patient has restarted her Lisinopril 20 mg.  Patient also has stopped the Mucinex-DM she was taking.  BP normotensive in clinic.  Asymptomatic.  Patient doing well.  Has appointment with her new Endocrinologist in a few days.  Patient is monitoring diet and trying to exercise more.  Past Medical History  Diagnosis Date  . Hyperlipidemia   . Hypertension   . Diabetes mellitus     type 2 uncontrolled  . Depression   . Inflammatory bowel disease   . Head ache     daily  . History of DVT (deep vein thrombosis) 2000  . Chronic back pain     Current Outpatient Prescriptions on File Prior to Visit  Medication Sig Dispense Refill  . aspirin 81 MG tablet Take 81 mg by mouth daily.        . insulin regular human CONCENTRATED (HUMULIN R) 500 UNIT/ML SOLN injection Inject 20 Units into the skin as directed. 15-15-8 daily      . lisinopril (PRINIVIL,ZESTRIL) 20 MG tablet Take 1 tablet (20 mg total) by mouth daily.  30 tablet  5  . Multiple Vitamin (MULTIVITAMIN) tablet Take 1 tablet by mouth daily.        . ONE TOUCH ULTRA TEST test strip TEST 3 TIMES A DAY AS DIRECTED -- DIAGNOSIS 250.02  100 each  2   No current facility-administered medications on file prior to visit.    No Known Allergies  Family History  Problem Relation Age of Onset  . Alcohol abuse Other   . Arthritis Other   . Cancer Other     breast, ovarian,prostate  . Heart disease Other     CAd  . Diabetes Other   . Hyperlipidemia Other   . Hypertension Other   . Kidney disease Other   . Stroke Other   . Colon cancer Maternal Aunt   . Colon cancer Maternal Uncle   . Colon cancer Paternal Grandfather     History   Social History  . Marital Status: Married    Spouse Name: N/A    Number of Children: 2  . Years of Education: N/A   Occupational History  . [employer]    Social History Main Topics  . Smoking status: Never Smoker   . Smokeless tobacco: Never Used   . Alcohol Use: No  . Drug Use: No  . Sexual Activity: None   Other Topics Concern  . None   Social History Narrative  . None    Review of Systems - See HPI.  All other ROS are negative.  BP 126/88  Pulse 79  Temp(Src) 98.4 F (36.9 C) (Oral)  Resp 16  Ht 5' 7" (1.702 m)  Wt 223 lb 8 oz (101.379 kg)  BMI 35.00 kg/m2  SpO2 96%  Physical Exam  Constitutional: She is oriented to person, place, and time and well-developed, well-nourished, and in no distress.  HENT:  Head: Normocephalic and atraumatic.  Eyes: Conjunctivae are normal. Pupils are equal, round, and reactive to light.  Neck: Neck supple.  Cardiovascular: Normal rate, regular rhythm, normal heart sounds and intact distal pulses.   Pulmonary/Chest: Effort normal and breath sounds normal. No respiratory distress. She has no wheezes. She has no rales. She exhibits no tenderness.  Neurological: She is alert and oriented to person, place, and time.  Skin: Skin is warm and dry. No rash noted.  Psychiatric: Affect normal.    Recent Results (from  the past 2160 hour(s))  CBC WITH DIFFERENTIAL     Status: Abnormal   Collection Time    05/09/13  4:31 PM      Result Value Ref Range   WBC 7.5  4.0 - 10.5 K/uL   RBC 5.27 (*) 3.87 - 5.11 MIL/uL   Hemoglobin 13.8  12.0 - 15.0 g/dL   HCT 41.3  36.0 - 46.0 %   MCV 78.4  78.0 - 100.0 fL   MCH 26.2  26.0 - 34.0 pg   MCHC 33.4  30.0 - 36.0 g/dL   RDW 14.8  11.5 - 15.5 %   Platelets 304  150 - 400 K/uL   Neutrophils Relative % 59  43 - 77 %   Neutro Abs 4.4  1.7 - 7.7 K/uL   Lymphocytes Relative 33  12 - 46 %   Lymphs Abs 2.5  0.7 - 4.0 K/uL   Monocytes Relative 6  3 - 12 %   Monocytes Absolute 0.5  0.1 - 1.0 K/uL   Eosinophils Relative 2  0 - 5 %   Eosinophils Absolute 0.2  0.0 - 0.7 K/uL   Basophils Relative 0  0 - 1 %   Basophils Absolute 0.0  0.0 - 0.1 K/uL   Smear Review Criteria for review not met    BASIC METABOLIC PANEL     Status: Abnormal   Collection Time     05/09/13  4:31 PM      Result Value Ref Range   Sodium 138  135 - 145 mEq/L   Potassium 3.9  3.5 - 5.3 mEq/L   Chloride 101  96 - 112 mEq/L   CO2 29  19 - 32 mEq/L   Glucose, Bld 148 (*) 70 - 99 mg/dL   BUN 16  6 - 23 mg/dL   Creat 0.59  0.50 - 1.10 mg/dL   Calcium 10.3  8.4 - 10.5 mg/dL  HEPATIC FUNCTION PANEL     Status: Abnormal   Collection Time    05/09/13  4:31 PM      Result Value Ref Range   Total Bilirubin 0.4  0.2 - 1.2 mg/dL   Bilirubin, Direct 0.1  0.0 - 0.3 mg/dL   Indirect Bilirubin 0.3  0.2 - 1.2 mg/dL   Alkaline Phosphatase 92  39 - 117 U/L   AST 20  0 - 37 U/L   ALT 39 (*) 0 - 35 U/L   Total Protein 7.9  6.0 - 8.3 g/dL   Albumin 4.6  3.5 - 5.2 g/dL  HEMOGLOBIN A1C     Status: Abnormal   Collection Time    05/09/13  4:31 PM      Result Value Ref Range   Hemoglobin A1C 10.0 (*) <5.7 %   Comment:                                                                            According to the ADA Clinical Practice Recommendations for 2011, when     HbA1c is used as a screening test:             >=6.5%   Diagnostic of Diabetes Mellitus                (  if abnormal result is confirmed)           5.7-6.4%   Increased risk of developing Diabetes Mellitus           References:Diagnosis and Classification of Diabetes Mellitus,Diabetes     PXTG,6269,48(NIOEV 1):S62-S69 and Standards of Medical Care in             Diabetes - 2011,Diabetes OJJK,0938,18 (Suppl 1):S11-S61.         Mean Plasma Glucose 240 (*) <117 mg/dL  URINALYSIS, ROUTINE W REFLEX MICROSCOPIC     Status: Abnormal   Collection Time    05/09/13  4:31 PM      Result Value Ref Range   Color, Urine YELLOW  YELLOW   APPearance CLEAR  CLEAR   Specific Gravity, Urine 1.021  1.005 - 1.030   pH 6.0  5.0 - 8.0   Glucose, UA 250 (*) NEG mg/dL   Bilirubin Urine NEG  NEG   Ketones, ur NEG  NEG mg/dL   Hgb urine dipstick NEG  NEG   Protein, ur NEG  NEG mg/dL   Urobilinogen, UA 0.2  0.0 - 1.0 mg/dL   Nitrite NEG   NEG   Leukocytes, UA NEG  NEG  MICROALBUMIN / CREATININE URINE RATIO     Status: None   Collection Time    05/09/13  4:31 PM      Result Value Ref Range   Microalb, Ur 1.21  0.00 - 1.89 mg/dL   Creatinine, Urine 127.6     Microalb Creat Ratio 9.5  0.0 - 30.0 mg/g    Assessment/Plan: HYPERTENSION BP improved.  Continue Lisinopril 20 mg daily.  DASH diet handout given.  Continue diet/exercise.  Follow-up in 6 months.

## 2013-05-28 ENCOUNTER — Ambulatory Visit (INDEPENDENT_AMBULATORY_CARE_PROVIDER_SITE_OTHER): Payer: 59 | Admitting: Endocrinology

## 2013-05-28 ENCOUNTER — Other Ambulatory Visit: Payer: Self-pay | Admitting: *Deleted

## 2013-05-28 ENCOUNTER — Encounter: Payer: Self-pay | Admitting: Endocrinology

## 2013-05-28 VITALS — BP 124/82 | HR 78 | Temp 98.2°F | Resp 16 | Ht 67.0 in | Wt 221.4 lb

## 2013-05-28 DIAGNOSIS — E1165 Type 2 diabetes mellitus with hyperglycemia: Principal | ICD-10-CM

## 2013-05-28 DIAGNOSIS — I10 Essential (primary) hypertension: Secondary | ICD-10-CM

## 2013-05-28 DIAGNOSIS — IMO0001 Reserved for inherently not codable concepts without codable children: Secondary | ICD-10-CM

## 2013-05-28 DIAGNOSIS — E785 Hyperlipidemia, unspecified: Secondary | ICD-10-CM

## 2013-05-28 DIAGNOSIS — E049 Nontoxic goiter, unspecified: Secondary | ICD-10-CM

## 2013-05-28 MED ORDER — ONETOUCH DELICA LANCETS FINE MISC: Status: DC

## 2013-05-28 MED ORDER — GLUCOSE BLOOD VI STRP: ORAL_STRIP | Status: AC

## 2013-05-28 MED ORDER — INSULIN ISOPHANE HUMAN 100 UNIT/ML KWIKPEN: 25.0000 [IU] | PEN_INJECTOR | Freq: Every day | SUBCUTANEOUS | Status: DC

## 2013-05-28 NOTE — Progress Notes (Signed)
Patient ID: [name] [name], female   DOB: [birth year], 48 y.o.   MRN: [id number]    Reason for Appointment : Consultation for Type 2 Diabetes  History of Present Illness          Diagnosis: Type 2 diabetes mellitus, date of diagnosis: 1984      Past history: She had gestational diabetes treated with diet with her first 2 pregnancies at ages 27 and 15. Subsequently she was not on any medications for 4-5 years  Her initial treatment was with metformin which she could not tolerate well. Also  has been tried on Actos which apparently caused swelling She did not have good controlled with Januvia During the hospitalization in 2004 she was started on insulin because of higher readings Subsequently had been on a regimen of Lantus and NovoLog  About 4 years ago her A1c has been fairly good between 7-8% She was also tried on Victoza subsequently by her endocrinologist but she does not think she had better controlled with this   Recent history:  Her blood sugar control has been poor for at least the last 3 years but no detailed records are available In early 2014 glucose of poor control she was changed from Lantus and NovoLog to U-500 insulin She is taking this 3 times a day but appears to have persistently high A1c readings of around 10% She is taking her evening insulin at 8 PM and not before supper. She is trying to take the insulin 15-30 minutes before breakfast and lunch She thinks that generally she has a tendency to low sugars overnight In 11/14 she was tried on Invokana but because of problems with candida vaginitis and bladder infections she could not continue this despite trying this several times. She did have significantly better readings with Invokana and also lost some weight. She thinks her blood sugars are better last month when she was on antibiotics but higher in the last 2 weeks  INSULIN regimen: U-5 sixths 00 insulin, 15 units before breakfast and lunch and 8 units at 8 PM  Oral  hypoglycemic drugs the patient is taking are: None      Side effects from medications have been: Metformin caused diarrhea  Glucose monitoring:  done one time a day         Glucometer: One Touch ultra Smart.      Blood Glucose readings recently have been mostly over 200 throughout the day. Unable to download her meter because of incorrect year programmed. Previously had better glucose readings in 3/15 with morning readings averaging about 105 and evening readings about 194 with overall average in March of 161 Hypoglycemia: None recently  Glycemic control:  Lab Results  Component Value Date   HGBA1C 10.0* 05/09/2013   HGBA1C 11.8* 09/13/2011   HGBA1C 10.6* 05/25/2011   Lab Results  Component Value Date   MICROALBUR 1.21 05/09/2013   LDLCALC 119* 09/13/2011   CREATININE 0.59 05/09/2013    Self-care: The diet that the patient has been following is: tries to limit carbohydrates, grilled food.      Meals: 3 meals and 2 snacks per day.  mealtimes: 7 AM, 12 noon and 6 PM         Exercise: 3/7 days a week with walking         Dietician visit: Most recent:.               Compliance with the medical regimen: Good Retinal exam: Most recent: 11/14. Has had retinopathy with  some bleeding   Weight history:  Wt Readings from Last 3 Encounters:  05/28/13 221 lb 6.4 oz (100.426 kg)  05/23/13 223 lb 8 oz (101.379 kg)  05/09/13 227 lb 8 oz (103.193 kg)      Medication List       This list is accurate as of: 05/28/13 11:59 PM.  Always use your most recent med list.               aspirin 81 MG tablet  Take 81 mg by mouth daily.     glucose blood test strip  Commonly known as:  ONETOUCH VERIO  Use as instructed to check blood sugar 4 times per day dx code 250.02     Insulin Isophane Human 100 UNIT/ML Kiwkpen  Commonly known as:  HUMULIN N KWIKPEN  Inject 25 Units into the skin daily.     insulin regular human CONCENTRATED 500 UNIT/ML Soln injection  Commonly known as:  HUMULIN R  Inject  20 Units into the skin as directed. 15-15-8 daily     lisinopril 20 MG tablet  Commonly known as:  PRINIVIL,ZESTRIL  Take 1 tablet (20 mg total) by mouth daily.     multivitamin tablet  Take 1 tablet by mouth daily.     ONETOUCH DELICA LANCETS FINE Misc  Use to obtain a blood specimen 4 times per day dx code 250.02        Allergies: No Known Allergies  Past Medical History  Diagnosis Date  . Hyperlipidemia   . Hypertension   . Diabetes mellitus     type 2 uncontrolled  . Depression   . Inflammatory bowel disease   . Head ache     daily  . History of DVT (deep vein thrombosis) 2000  . Chronic back pain     Past Surgical History  Procedure Laterality Date  . Spine surgery  03/2008    Dr Marcial Pacas  . Oophorectomy  2008  . Abdominal hysterectomy  1990  . Pelvic laparoscopy      for endometriosis    Family History  Problem Relation Age of Onset  . Alcohol abuse Other   . Arthritis Other   . Cancer Other     breast, ovarian,prostate  . Heart disease Other     CAd  . Diabetes Other   . Hyperlipidemia Other   . Hypertension Other   . Kidney disease Other   . Stroke Other   . Colon cancer Maternal Aunt   . Colon cancer Maternal Uncle   . Colon cancer Paternal Grandfather   . Heart disease Mother   . Diabetes Mother   . Heart disease Father   . Diabetes Father     Social History:  reports that she has never smoked. She has never used smokeless tobacco. She reports that she does not drink alcohol or use illicit drugs.    Review of Systems       Lipids: None medications none. Previously tried Lipitor which caused leg pain. No recent lab results available  Lab Results  Component Value Date   CHOL 207* 09/13/2011   HDL 39* 09/13/2011   LDLCALC 119* 09/13/2011   TRIG 247* 09/13/2011   CHOLHDL 5.3 09/13/2011                   Skin: No rash or infections     Thyroid:  No  unusual fatigue. Has not been told about thyroid disease in the past  Lab Results  Component Value Date   TSH 2.360 06/15/2010       The blood pressure has been treated with medications since about 2003     No swelling of feet.     No shortness of breath on exertion.     Bowel habits: Normal.       No frequency of urination or nocturia       No joint  pains.       Has history of depression, currently not on medications          No history of Numbness or burning in  feet. Has occasional tingling     LABS:  No visits with results within 1 Week(s) from this visit. Latest known visit with results is:  Office Visit on 05/09/2013  Component Date Value Ref Range Status  . WBC 05/09/2013 7.5  4.0 - 10.5 K/uL Final  . RBC 05/09/2013 5.27* 3.87 - 5.11 MIL/uL Final  . Hemoglobin 05/09/2013 13.8  12.0 - 15.0 g/dL Final  . HCT 05/09/2013 41.3  36.0 - 46.0 % Final  . MCV 05/09/2013 78.4  78.0 - 100.0 fL Final  . MCH 05/09/2013 26.2  26.0 - 34.0 pg Final  . MCHC 05/09/2013 33.4  30.0 - 36.0 g/dL Final  . RDW 05/09/2013 14.8  11.5 - 15.5 % Final  . Platelets 05/09/2013 304  150 - 400 K/uL Final  . Neutrophils Relative % 05/09/2013 59  43 - 77 % Final  . Neutro Abs 05/09/2013 4.4  1.7 - 7.7 K/uL Final  . Lymphocytes Relative 05/09/2013 33  12 - 46 % Final  . Lymphs Abs 05/09/2013 2.5  0.7 - 4.0 K/uL Final  . Monocytes Relative 05/09/2013 6  3 - 12 % Final  . Monocytes Absolute 05/09/2013 0.5  0.1 - 1.0 K/uL Final  . Eosinophils Relative 05/09/2013 2  0 - 5 % Final  . Eosinophils Absolute 05/09/2013 0.2  0.0 - 0.7 K/uL Final  . Basophils Relative 05/09/2013 0  0 - 1 % Final  . Basophils Absolute 05/09/2013 0.0  0.0 - 0.1 K/uL Final  . Smear Review 05/09/2013 Criteria for review not met   Final  . Sodium 05/09/2013 138  135 - 145 mEq/L Final  . Potassium 05/09/2013 3.9  3.5 - 5.3 mEq/L Final  . Chloride 05/09/2013 101  96 - 112 mEq/L Final  . CO2 05/09/2013 29  19 - 32 mEq/L Final  . Glucose, Bld 05/09/2013 148* 70 - 99 mg/dL Final  . BUN 05/09/2013 16  6 - 23  mg/dL Final  . Creat 05/09/2013 0.59  0.50 - 1.10 mg/dL Final  . Calcium 05/09/2013 10.3  8.4 - 10.5 mg/dL Final  . Total Bilirubin 05/09/2013 0.4  0.2 - 1.2 mg/dL Final  . Bilirubin, Direct 05/09/2013 0.1  0.0 - 0.3 mg/dL Final  . Indirect Bilirubin 05/09/2013 0.3  0.2 - 1.2 mg/dL Final  . Alkaline Phosphatase 05/09/2013 92  39 - 117 U/L Final  . AST 05/09/2013 20  0 - 37 U/L Final  . ALT 05/09/2013 39* 0 - 35 U/L Final  . Total Protein 05/09/2013 7.9  6.0 - 8.3 g/dL Final  . Albumin 05/09/2013 4.6  3.5 - 5.2 g/dL Final  . Hemoglobin A1C 05/09/2013 10.0* <5.7 % Final   Comment:  According to the ADA Clinical Practice Recommendations for 2011, when                          HbA1c is used as a screening test:                                                       >=6.5%   Diagnostic of Diabetes Mellitus                                     (if abnormal result is confirmed)                                                     5.7-6.4%   Increased risk of developing Diabetes Mellitus                                                     References:Diagnosis and Classification of Diabetes Mellitus,Diabetes                          JGGE,3662,94(TMLYY 1):S62-S69 and Standards of Medical Care in                                  Diabetes - 2011,Diabetes TKPT,4656,81 (Suppl 1):S11-S61.                             . Mean Plasma Glucose 05/09/2013 240* <117 mg/dL Final  . Color, Urine 05/09/2013 YELLOW  YELLOW Final  . APPearance 05/09/2013 CLEAR  CLEAR Final  . Specific Gravity, Urine 05/09/2013 1.021  1.005 - 1.030 Final  . pH 05/09/2013 6.0  5.0 - 8.0 Final  . Glucose, UA 05/09/2013 250* NEG mg/dL Final  . Bilirubin Urine 05/09/2013 NEG  NEG Final  . Ketones, ur 05/09/2013 NEG  NEG mg/dL Final  . Hgb urine dipstick 05/09/2013 NEG  NEG Final  . Protein, ur 05/09/2013 NEG  NEG mg/dL Final  .  Urobilinogen, UA 05/09/2013 0.2  0.0 - 1.0 mg/dL Final  . Nitrite 05/09/2013 NEG  NEG Final  . Leukocytes, UA 05/09/2013 NEG  NEG Final  . Microalb, Ur 05/09/2013 1.21  0.00 - 1.89 mg/dL Final  . Creatinine, Urine 05/09/2013 127.6   Final  . Microalb Creat Ratio 05/09/2013 9.5  0.0 - 30.0 mg/g Final    Physical Examination:  BP 124/82  Pulse 78  Temp(Src) 98.2 F (36.8 C)  Resp 16  Ht _0  (1.702 m)  Wt 221 lb 6.4 oz (100.426 kg)  BMI 34.67 kg/m2  SpO2 95%  GENERAL:         Patient has generalized obesity.   HEENT:         Eye exam shows normal external appearance. Fundus exam shows no retinopathy.  Oral exam shows normal mucosa .  NECK:         General:  Neck exam shows no lymphadenopathy. Carotids are normal to palpation and no bruit heard.  Thyroid is enlarged about 2 times normal, smooth, rubbery, diffuse and no nodules felt.   LUNGS:         Chest is symmetrical. Lungs are clear to auscultation.Marland Kitchen   HEART:         Heart sounds:  S1 and S2 are normal. No murmurs or clicks heard., no S3 or S4.   ABDOMEN:         General:  There is no distention present. Liver and spleen are not palpable. No other mass or tenderness present.  EXTREMITIES:     There is no edema. No skin lesions present.Marland Kitchen  NEUROLOGICAL:        Vibration sense is markedly reduced in toes. Ankle jerks are absent bilaterally.          Diabetic foot exam:  as in the foot exam section MUSCULOSKELETAL:       There is no enlargement or deformity of the joints. Spine is normal to inspection.Marland Kitchen   PEDAL pulses: Normal SKIN:  she has changes of acanthosis on her neck with some thickening, itching and pigmentation and mild pigmentation on her knuckles No other pigmentation, rash or lesions      ASSESSMENT:  Diabetes type 2, uncontrolled    Patient has obesity and insulin resistance, currently requiring large doses of insulin without adequate control She has evidence of acanthosis on her exam and evidence of metabolic  syndrome with history of hypertension and hypertriglyceridemia He has had difficulty tolerating certain medications including metformin, Actos and Invokana Appears to have had little response to adding Victoza previously but details not available Blood sugar patterns and current management discussed in detail in history of present illness Since most of her high readings are during the day and she has occasional hypoglycemia overnight she appears to need increased insulin coverage during the daytime Also needs an insulin sensitizer  Complications: Has known retinopathy, mild signs of neuropathy  HYPERLIPIDEMIA: This will need to be reassessed when blood sugars are better controlled  Goiter on exam: Likely to be Hashimoto's thyroiditis Will check thyroid levels on her next visit  Hypertension: Appears well controlled on lisinopril  PLAN:    Start using the One Touch Verio monitor and check blood sugars consistently before meals and bedtime. Explained how to use this today  Change the evening U-500 insulin dosage to taking before suppertime instead of 8 PM  Increase the doses of U 500 as follows: 18-18-10 for now  Trial of Glumetza 500 mg daily and if tolerated increase to 1000 mg until next visit  Trial of Humulin NPH 25 units in the morning to help control daytime hyperglycemia  Consider trial of Trulicity if blood sugar is not improved and if weight gain becomes an issue  Consider insulin pump   Jakarie Pember 05/29/2013, 11:44 AM

## 2013-05-28 NOTE — Patient Instructions (Signed)
Glumetza 500mg  with dinner and if tolerated take 2 after 1 week  Humulin N, 25 units in daily  U-500 insulin, 18 units before bFst and lunch, and 10 at dinner,   Reduce supper dose gradually if overnight falls <140

## 2013-06-01 NOTE — Telephone Encounter (Signed)
Notes Recorded by Rockwell Germany, CMA on 05/11/2013 at 4:31 PM Patient informed, understood & agreed, f/u appt 04.01.15 at 3:45p/SLS  ------  Notes Recorded by Rockwell Germany, CMA on 05/10/2013 at 11:06 AM [facility] with contact name and number for return call RE: results and further provider instructions/SLS  ------  Notes Recorded by Leeanne Rio, PA-C on 05/10/2013 at 7:17 AM Liver enzymes much improved from her lab results at the Urgent Care. Liver function is back to her baseline. No worrisome findings. Other labs look good. Will see her in 2 weeks for BP recheck. Will discuss her lab results further at that time.

## 2013-09-25 ENCOUNTER — Ambulatory Visit (INDEPENDENT_AMBULATORY_CARE_PROVIDER_SITE_OTHER): Payer: 59 | Admitting: Endocrinology

## 2013-09-25 ENCOUNTER — Other Ambulatory Visit: Payer: Self-pay | Admitting: *Deleted

## 2013-09-25 ENCOUNTER — Encounter: Payer: Self-pay | Admitting: Endocrinology

## 2013-09-25 VITALS — BP 136/99 | HR 73 | Temp 98.0°F | Resp 16 | Ht 67.0 in | Wt 224.2 lb

## 2013-09-25 DIAGNOSIS — E1165 Type 2 diabetes mellitus with hyperglycemia: Principal | ICD-10-CM

## 2013-09-25 DIAGNOSIS — IMO0001 Reserved for inherently not codable concepts without codable children: Secondary | ICD-10-CM

## 2013-09-25 DIAGNOSIS — I10 Essential (primary) hypertension: Secondary | ICD-10-CM

## 2013-09-25 DIAGNOSIS — E785 Hyperlipidemia, unspecified: Secondary | ICD-10-CM

## 2013-09-25 LAB — LIPID PANEL
Cholesterol: 225 mg/dL — ABNORMAL HIGH (ref 0–200)
HDL: 36.8 mg/dL — ABNORMAL LOW (ref >39.00)
LDL Cholesterol: 167 mg/dL — ABNORMAL HIGH (ref 0–99)
NonHDL: 188.2
Total CHOL/HDL Ratio: 6
Triglycerides: 106 mg/dL (ref 0.0–149.0)
VLDL: 21.2 mg/dL (ref 0.0–40.0)

## 2013-09-25 LAB — HEMOGLOBIN A1C: Hgb A1c MFr Bld: 11 % — ABNORMAL HIGH (ref 4.6–6.5)

## 2013-09-25 MED ORDER — INSULIN ASPART 100 UNIT/ML FLEXPEN
PEN_INJECTOR | SUBCUTANEOUS | Status: DC
Start: 2013-09-25 — End: 2016-06-25

## 2013-09-25 NOTE — Patient Instructions (Signed)
U-500 insulin, 22 units on waking up,  and  At 15 lunch and 0 units at supper  Novolog 10 units at lunch, also at supper if sugars > 180 after supper

## 2013-09-25 NOTE — Progress Notes (Signed)
Patient ID: [name] [name], female   DOB: 1965-06-29, 48 y.o.   MRN: [id number]    Reason for Appointment : Followup for Type 2 Diabetes  History of Present Illness          Diagnosis: Type 2 diabetes mellitus, date of diagnosis: 1984      Past history: She had gestational diabetes treated with diet with her first 2 pregnancies at ages 49 and 68. Subsequently she was not on any medications for 4-5 years  Her initial treatment was with metformin which she could not tolerate well. Also  has been tried on Actos which apparently caused swelling She did not have good controlled with Januvia During the hospitalization in 2004 she was started on insulin because of higher readings Subsequently had been on a regimen of Lantus and NovoLog  About 4 years ago her A1c has been fairly good between 7-8% She was also tried on Victoza subsequently by her endocrinologist but she does not think she had better control with this  Her blood sugar control has been poor for at least the last 3 years  Recent history:  In early 2014 glucose of poor control she was changed from Lantus and NovoLog to U-500 insulin She is taking this 3 times a day but has had persistently high A1c readings of around 10% She has not been regular with her followup after initial consultation in 4/15 She has  acanthosis on her exam and evidence of metabolic syndrome with history of hypertension and hypertriglyceridemia He has had difficulty tolerating certain medications including metformin, Actos and Invokana In 4/15 she was given additional Humulin NPH 25 units in the morning to help with daytime hyperglycemia as also increased the doses of her U-500 insulin She was also given a trial of Glumetza with a sample which she thinks helped her blood sugar control but she did not tolerate it because of diarrhea She has been checking her blood sugar somewhat sporadically with the following patterns:  Blood sugars are usually low normal overnight  and fasting except for a couple of high readings  Blood sugars are mostly high between 10 AM-5 PM with readings as high as 350  Blood sugars are variable in the evenings but too high readings, highest 480 when she went off her diet  INSULIN regimen: U-5 00 insulin, 15 units before breakfast and lunch and 8 units at 6 PM, Humulin NPH 25 every morning  Oral hypoglycemic drugs the patient is taking are: None      Side effects from medications have been: Metformin caused diarrhea  Glucose monitoring:  done 0.6 times a day        Glucometer: One Touch ultra Smart.    PREMEAL Breakfast Lunch Dinner overnight  Overall  Glucose range:  71-359   186   147-480   66-78    Mean/median:      193    POST-MEAL PC Breakfast PC Lunch PC Dinner  Glucose range:  183   350, 319   79-354   Mean/median:      Hypoglycemia: Occasionally overnight  Glycemic control:  Lab Results  Component Value Date   HGBA1C 10.0* 05/09/2013   HGBA1C 11.8* 09/13/2011   HGBA1C 10.6* 05/25/2011   Lab Results  Component Value Date   MICROALBUR 1.21 05/09/2013   LDLCALC 119* 09/13/2011   CREATININE 0.59 05/09/2013    Self-care: The diet that the patient has been following is: tries to limit carbohydrates, grilled food.  Meals: 3 meals and 2 snacks per day.  mealtimes: 8 AM, 12 noon and 6 PM         Exercise: 6/7 days a week with walking         Dietician visit: Most recent:?.               Compliance with the medical regimen: Fair Retinal exam: Most recent: 11/14. Has had retinopathy with some bleeding   Weight history:  Wt Readings from Last 3 Encounters:  09/25/13 224 lb 3.2 oz (101.696 kg)  05/28/13 221 lb 6.4 oz (100.426 kg)  05/23/13 223 lb 8 oz (101.379 kg)      Medication List       This list is accurate as of: 09/25/13  8:51 AM.  Always use your most recent med list.               glucose blood test strip  Commonly known as:  ONETOUCH VERIO  Use as instructed to check blood sugar 4 times per  day dx code 250.02     Insulin NPH (Human) (Isophane) 100 UNIT/ML Kiwkpen  Commonly known as:  HUMULIN N KWIKPEN  Inject 25 Units into the skin daily.     insulin regular human CONCENTRATED 500 UNIT/ML Soln injection  Commonly known as:  HUMULIN R  Inject 20 Units into the skin as directed. 15-15-8 daily     lisinopril 20 MG tablet  Commonly known as:  PRINIVIL,ZESTRIL  Take 1 tablet (20 mg total) by mouth daily.     ONETOUCH DELICA LANCETS FINE Misc  Use to obtain a blood specimen 4 times per day dx code 250.02        Allergies: No Known Allergies  Past Medical History  Diagnosis Date  . Hyperlipidemia   . Hypertension   . Diabetes mellitus     type 2 uncontrolled  . Depression   . Inflammatory bowel disease   . Head ache     daily  . History of DVT (deep vein thrombosis) 2000  . Chronic back pain     Past Surgical History  Procedure Laterality Date  . Spine surgery  03/2008    Dr Marcial Pacas  . Oophorectomy  2008  . Abdominal hysterectomy  1990  . Pelvic laparoscopy      for endometriosis    Family History  Problem Relation Age of Onset  . Alcohol abuse Other   . Arthritis Other   . Cancer Other     breast, ovarian,prostate  . Heart disease Other     CAd  . Diabetes Other   . Hyperlipidemia Other   . Hypertension Other   . Kidney disease Other   . Stroke Other   . Colon cancer Maternal Aunt   . Colon cancer Maternal Uncle   . Colon cancer Paternal Grandfather   . Heart disease Mother   . Diabetes Mother   . Heart disease Father   . Diabetes Father     Social History:  reports that she has never smoked. She has never used smokeless tobacco. She reports that she does not drink alcohol or use illicit drugs.    Review of Systems       Lipids: None medications none. Previously tried Lipitor which caused leg pain. No recent lab results available. Also was asked to try Livalo.  Lab Results  Component Value Date   CHOL 207* 09/13/2011   HDL 39*  09/13/2011   LDLCALC 119* 09/13/2011  TRIG 247* 09/13/2011   CHOLHDL 5.3 09/13/2011                    The blood pressure has been treated with medications since about 2003     LABS:    Physical Examination:  BP 136/99  Pulse 73  Temp(Src) 98 F (36.7 C)  Resp 16  Ht 5\' 7"  (1.702 m)  Wt 224 lb 3.2 oz (101.696 kg)  BMI 35.11 kg/m2  SpO2 97%   ASSESSMENT:  Diabetes type 2, uncontrolled    Patient has obesity and insulin resistance,  requiring large doses of insulin without adequate control For some reason her blood sugars appear to the highest mid day with current regimen of U-500 insulin along with morning NPH. Blood sugars did not appear to be any better than the last visit Discussed either adjusting the insulin further or possibly using an insulin pump Discussed how insulin pump works and how it will benefit her and given brochures on this. Appears to have had little response to adding Victoza previously but details not available Blood sugar patterns and current management discussed in detail in history of present illness  Hypertension: Appears to have higher reading today  PLAN:   Increase first dose of U-500 insulin significantly and take it on waking up. Start taking NovoLog insulin at lunchtime and probably at suppertime also Will not use U-500 insulin at suppertime for now to avoid overnight hypoglycemia Discussed blood sugar targets and timing of glucose monitoring, needs more consistent monitoring  Patient Instructions  U-500 insulin, 22 units on waking up,  and  At 15 lunch and 0 units at supper  Novolog 10 units at lunch, also at supper if sugars > 180 after supper   Counseling time over 50% of today's 25 minute visit   [name] [name] 09/25/2013, 8:51 AM

## 2013-09-28 ENCOUNTER — Other Ambulatory Visit: Payer: Self-pay | Admitting: *Deleted

## 2013-09-28 MED ORDER — PRAVASTATIN SODIUM 40 MG PO TABS: 40.0000 mg | ORAL_TABLET | Freq: Every day | ORAL | Status: AC

## 2013-09-28 NOTE — Progress Notes (Signed)
Quick Note:  Please let patient know that the A1c is 11; Cholesterol 225, start pravastatin 40 mg daily ______

## 2013-10-01 ENCOUNTER — Telehealth: Payer: Self-pay | Admitting: Endocrinology

## 2013-10-01 NOTE — Telephone Encounter (Signed)
Returning call for lab results °

## 2013-10-23 ENCOUNTER — Ambulatory Visit: Payer: 59 | Admitting: Endocrinology

## 2013-11-06 ENCOUNTER — Ambulatory Visit (INDEPENDENT_AMBULATORY_CARE_PROVIDER_SITE_OTHER): Payer: 59 | Admitting: Endocrinology

## 2013-11-06 ENCOUNTER — Encounter: Payer: Self-pay | Admitting: Endocrinology

## 2013-11-06 VITALS — BP 139/86 | HR 87 | Temp 98.5°F | Resp 16 | Ht 67.0 in | Wt 223.8 lb

## 2013-11-06 DIAGNOSIS — Z23 Encounter for immunization: Secondary | ICD-10-CM

## 2013-11-06 DIAGNOSIS — E785 Hyperlipidemia, unspecified: Secondary | ICD-10-CM

## 2013-11-06 DIAGNOSIS — I10 Essential (primary) hypertension: Secondary | ICD-10-CM

## 2013-11-06 DIAGNOSIS — IMO0001 Reserved for inherently not codable concepts without codable children: Secondary | ICD-10-CM

## 2013-11-06 DIAGNOSIS — E1165 Type 2 diabetes mellitus with hyperglycemia: Principal | ICD-10-CM

## 2013-11-06 MED ORDER — LISINOPRIL 40 MG PO TABS: 40.0000 mg | ORAL_TABLET | Freq: Every day | ORAL | Status: AC

## 2013-11-06 NOTE — Progress Notes (Signed)
Patient ID: [name] [name], female   DOB: 1965-07-02, 48 y.o.   MRN: [id number]    Reason for Appointment : Followup for Type 2 Diabetes  History of Present Illness          Diagnosis: Type 2 diabetes mellitus, date of diagnosis: 1984      Past history: She had gestational diabetes treated with diet with her first 2 pregnancies at ages 58 and 60. Subsequently she was not on any medications for 4-5 years  Her initial treatment was with metformin which she could not tolerate well. Also  has been tried on Actos which apparently caused swelling She did not have good controlled with Januvia During the hospitalization in 2004 she was started on insulin because of higher readings Subsequently had been on a regimen of Lantus and NovoLog  About 4 years ago her A1c has been fairly good between 7-8% She was also tried on Victoza subsequently by her endocrinologist but she does not think she had better control with this  Her blood sugar control has been poor for at least the last 3 years  Recent history:  Her A1c was up to 11% when she came back for followup in 8/15 Despite U-500 insulin and NPH she was having marked hyperglycemia midday and afternoon and sporadically at other times including fasting He was given instructions for increasing her U-500 insulin especially in the morning and also adding NovoLog for at least at lunch However she is using her own regimen of insulin now and adjusting dose periodically Has not taken any NPH insulin recently Current blood sugar patterns  She was having overnight hypoglycemia sporadically and she stopped taking her U-500 insulin at suppertime  Her fasting readings are consistently higher except once  She is having sporadic high readings later in the day, sometimes after lunch and occasionally after dinner depending on her carbohydrate intake  She does not adjust her NovoLog based on her carbohydrate intake including when eating spaghetti last night  INSULIN  regimen: U-500 insulin, 15 units on 7am,  22 acl  lunch and 0 units at supper Novolog 15-15-8 ac  Oral hypoglycemic drugs the patient is taking are: None      Side effects from medications have been: Metformin/Glumetza  caused diarrhea  Glucose monitoring:  done  1.2  times a day        Glucometer: One Touch ultra Smart.    PREMEAL Breakfast Lunch Dinner  PCS  Overall  Glucose range:  143-299   65, 217   74-270   103-292    Mean/median:  212     166     Hypoglycemia: Occasionally overnight, Less recently  Glycemic control:  Lab Results  Component Value Date   HGBA1C 11.0* 09/25/2013   HGBA1C 10.0* 05/09/2013   HGBA1C 11.8* 09/13/2011   Lab Results  Component Value Date   MICROALBUR 1.21 05/09/2013   LDLCALC 167* 09/25/2013   CREATININE 0.59 05/09/2013    Self-care: The diet that the patient has been following is: tries to limit carbohydrates, grilled food.      Meals: 3 meals and 2 snacks per day.  mealtimes: 8 AM, 12 noon and 6 PM         Exercise: 6/7 days a week with walking, exercise bike        Dietician visit: Most recent:?.               Compliance with the medical regimen: Fair Retinal exam: Most recent: 11/14. Has  had retinopathy with some bleeding   Weight history:  Wt Readings from Last 3 Encounters:  11/06/13 223 lb 12.8 oz (101.515 kg)  09/25/13 224 lb 3.2 oz (101.696 kg)  05/28/13 221 lb 6.4 oz (100.426 kg)      Medication List       This list is accurate as of: 11/06/13  9:22 PM.  Always use your most recent med list.               glucose blood test strip  Commonly known as:  ONETOUCH VERIO  Use as instructed to check blood sugar 4 times per day dx code 250.02     insulin aspart 100 UNIT/ML FlexPen  Commonly known as:  NOVOLOG FLEXPEN  Inject 10 units twice a day     insulin regular human CONCENTRATED 500 UNIT/ML Soln injection  Commonly known as:  HUMULIN R  Inject 20 Units into the skin as directed. 15-15-8 daily     lisinopril 40 MG tablet   Commonly known as:  PRINIVIL,ZESTRIL  Take 1 tablet (40 mg total) by mouth daily.     ONETOUCH DELICA LANCETS FINE Misc  Use to obtain a blood specimen 4 times per day dx code 250.02     pravastatin 40 MG tablet  Commonly known as:  PRAVACHOL  Take 1 tablet (40 mg total) by mouth daily.        Allergies: No Known Allergies  Past Medical History  Diagnosis Date  . Hyperlipidemia   . Hypertension   . Diabetes mellitus     type 2 uncontrolled  . Depression   . Inflammatory bowel disease   . Head ache     daily  . History of DVT (deep vein thrombosis) 2000  . Chronic back pain     Past Surgical History  Procedure Laterality Date  . Spine surgery  03/2008    Dr Marcial Pacas  . Oophorectomy  2008  . Abdominal hysterectomy  1990  . Pelvic laparoscopy      for endometriosis    Family History  Problem Relation Age of Onset  . Alcohol abuse Other   . Arthritis Other   . Cancer Other     breast, ovarian,prostate  . Heart disease Other     CAd  . Diabetes Other   . Hyperlipidemia Other   . Hypertension Other   . Kidney disease Other   . Stroke Other   . Colon cancer Maternal Aunt   . Colon cancer Maternal Uncle   . Colon cancer Paternal Grandfather   . Heart disease Mother   . Diabetes Mother   . Heart disease Father   . Diabetes Father     Social History:  reports that she has never smoked. She has never used smokeless tobacco. She reports that she does not drink alcohol or use illicit drugs.    Review of Systems       Lipids: None medications none. Previously tried Lipitor which caused leg pain. Also was asked to try Livalo But did not do so and now is taking pravastatin.  Lab Results  Component Value Date   CHOL 225* 09/25/2013   HDL 36.80* 09/25/2013   LDLCALC 167* 09/25/2013   TRIG 106.0 09/25/2013   CHOLHDL 6 09/25/2013                    The blood pressure has been treated with medications since about 2003 Home BP 90 diastolicRecently    Physical  Examination:  BP 139/86  Pulse 87  Temp(Src) 98.5 F (36.9 C)  Resp 16  Ht 5\' 7"  (1.702 m)  Wt 223 lb 12.8 oz (101.515 kg)  BMI 35.04 kg/m2  SpO2 97%   ASSESSMENT:  Diabetes type 2, uncontrolled    Patient has obesity and insulin resistance,  requiring large doses of insulin without adequate control  See history of present illness for discussion of current management, blood sugar patterns and problems identified Her blood sugars appear to the highest in the morning since she has arbitrarily stopped her U 500 in the evenings because of fear of nocturnal hypoglycemia She is still not understanding how to adjust her insulin and control overnight hypoglycemia as well as adjust his NovoLog for carbohydrate intake Discussed blood sugar targets at various times of the day and needing to check blood sugars more consistently at various times She is interested in trying an insulin pump and has not decided which one to use, has several questions about them  Hypertension: Appears to have higher reading today and she will increase her lisinopril to 40 mg  Hyperlipidemia: Lipids to be rechecked  PLAN:   As discussed in patient instructions She will start with U-500 insulin at suppertime gradually and increase the dose until morning sugar is controlled Followup the nurse educator and discuss starting insulin pump Better titration of NovoLog to her carbohydrate intake at all meals and she will need more education on carbohydrate counting Consider NPH or Levemir at bedtime if morning sugars are still high   Patient Instructions  5 units of U-500 at supper and adjust every 3 days to get am sugars are 120-140  Adjust Novolog to starch content of meals  40 Lisinopril    Counseling time over 50% of today's 25 minute visit   Darrien Laakso 11/06/2013, 9:22 PM

## 2013-11-06 NOTE — Patient Instructions (Addendum)
5 units of U-500 at supper and adjust every 3 days to get am sugars are 120-140  Adjust Novolog to starch content of meals  40 Lisinopril

## 2013-11-23 ENCOUNTER — Encounter: Payer: Self-pay | Admitting: Gastroenterology

## 2013-12-13 ENCOUNTER — Other Ambulatory Visit: Payer: 59

## 2013-12-19 ENCOUNTER — Ambulatory Visit: Payer: 59 | Admitting: Endocrinology

## 2013-12-19 DIAGNOSIS — Z0289 Encounter for other administrative examinations: Secondary | ICD-10-CM

## 2016-06-23 ENCOUNTER — Encounter (HOSPITAL_COMMUNITY)
Admission: EM | Disposition: A | Discharge status: Home Health | Payer: Self-pay | Source: Home / Self Care | Attending: Internal Medicine

## 2016-06-23 ENCOUNTER — Emergency Department (HOSPITAL_COMMUNITY): Payer: 59

## 2016-06-23 ENCOUNTER — Encounter (HOSPITAL_COMMUNITY): Payer: Self-pay | Admitting: *Deleted

## 2016-06-23 ENCOUNTER — Emergency Department (HOSPITAL_COMMUNITY): Payer: 59 | Admitting: Anesthesiology

## 2016-06-23 ENCOUNTER — Inpatient Hospital Stay (HOSPITAL_COMMUNITY)
Admission: EM | Admit: 2016-06-23 | Discharge: 2016-06-25 | DRG: 494 | Disposition: A | Discharge status: Home Health | Payer: 59 | Attending: Internal Medicine | Admitting: Internal Medicine

## 2016-06-23 DIAGNOSIS — Z79899 Other long term (current) drug therapy: Secondary | ICD-10-CM | POA: Diagnosis not present

## 2016-06-23 DIAGNOSIS — Z8249 Family history of ischemic heart disease and other diseases of the circulatory system: Secondary | ICD-10-CM | POA: Diagnosis not present

## 2016-06-23 DIAGNOSIS — S82251A Displaced comminuted fracture of shaft of right tibia, initial encounter for closed fracture: Secondary | ICD-10-CM | POA: Diagnosis not present

## 2016-06-23 DIAGNOSIS — E785 Hyperlipidemia, unspecified: Secondary | ICD-10-CM | POA: Diagnosis present

## 2016-06-23 DIAGNOSIS — Y9301 Activity, walking, marching and hiking: Secondary | ICD-10-CM | POA: Diagnosis present

## 2016-06-23 DIAGNOSIS — Z833 Family history of diabetes mellitus: Secondary | ICD-10-CM | POA: Diagnosis not present

## 2016-06-23 DIAGNOSIS — Z86718 Personal history of other venous thrombosis and embolism: Secondary | ICD-10-CM

## 2016-06-23 DIAGNOSIS — I10 Essential (primary) hypertension: Secondary | ICD-10-CM | POA: Diagnosis present

## 2016-06-23 DIAGNOSIS — M5440 Lumbago with sciatica, unspecified side: Secondary | ICD-10-CM

## 2016-06-23 DIAGNOSIS — Y92007 Garden or yard of unspecified non-institutional (private) residence as the place of occurrence of the external cause: Secondary | ICD-10-CM

## 2016-06-23 DIAGNOSIS — W010XXA Fall on same level from slipping, tripping and stumbling without subsequent striking against object, initial encounter: Secondary | ICD-10-CM | POA: Diagnosis present

## 2016-06-23 DIAGNOSIS — E669 Obesity, unspecified: Secondary | ICD-10-CM | POA: Diagnosis present

## 2016-06-23 DIAGNOSIS — S82831A Other fracture of upper and lower end of right fibula, initial encounter for closed fracture: Secondary | ICD-10-CM | POA: Diagnosis present

## 2016-06-23 DIAGNOSIS — S82401A Unspecified fracture of shaft of right fibula, initial encounter for closed fracture: Secondary | ICD-10-CM | POA: Diagnosis not present

## 2016-06-23 DIAGNOSIS — S82201A Unspecified fracture of shaft of right tibia, initial encounter for closed fracture: Secondary | ICD-10-CM

## 2016-06-23 DIAGNOSIS — E1165 Type 2 diabetes mellitus with hyperglycemia: Secondary | ICD-10-CM | POA: Diagnosis present

## 2016-06-23 DIAGNOSIS — E11319 Type 2 diabetes mellitus with unspecified diabetic retinopathy without macular edema: Secondary | ICD-10-CM | POA: Diagnosis present

## 2016-06-23 DIAGNOSIS — Z794 Long term (current) use of insulin: Secondary | ICD-10-CM | POA: Diagnosis not present

## 2016-06-23 DIAGNOSIS — E1169 Type 2 diabetes mellitus with other specified complication: Secondary | ICD-10-CM

## 2016-06-23 DIAGNOSIS — Z419 Encounter for procedure for purposes other than remedying health state, unspecified: Secondary | ICD-10-CM

## 2016-06-23 DIAGNOSIS — E876 Hypokalemia: Secondary | ICD-10-CM | POA: Diagnosis present

## 2016-06-23 DIAGNOSIS — Z09 Encounter for follow-up examination after completed treatment for conditions other than malignant neoplasm: Secondary | ICD-10-CM

## 2016-06-23 HISTORY — PX: TIBIA IM NAIL INSERTION: SHX2516

## 2016-06-23 LAB — SURGICAL PCR SCREEN
MRSA, PCR: NEGATIVE
Staphylococcus aureus: NEGATIVE

## 2016-06-23 LAB — BASIC METABOLIC PANEL
Anion gap: 9 (ref 5–15)
BUN: 16 mg/dL (ref 6–20)
CO2: 26 mmol/L (ref 22–32)
Calcium: 8.8 mg/dL — ABNORMAL LOW (ref 8.9–10.3)
Chloride: 103 mmol/L (ref 101–111)
Creatinine, Ser: 0.7 mg/dL (ref 0.44–1.00)
GFR calc Af Amer: >60 mL/min (ref >60)
GFR calc non Af Amer: >60 mL/min (ref >60)
Glucose, Bld: 112 mg/dL — ABNORMAL HIGH (ref 65–99)
Potassium: 3.4 mmol/L — ABNORMAL LOW (ref 3.5–5.1)
Sodium: 138 mmol/L (ref 135–145)

## 2016-06-23 LAB — GLUCOSE, CAPILLARY
Glucose-Capillary: 97 mg/dL (ref 65–99)
Glucose-Capillary: 123 mg/dL — ABNORMAL HIGH (ref 65–99)

## 2016-06-23 LAB — CBC
HCT: 38.8 % (ref 36.0–46.0)
Hemoglobin: 12.9 g/dL (ref 12.0–15.0)
MCH: 26 pg (ref 26.0–34.0)
MCHC: 33.2 g/dL (ref 30.0–36.0)
MCV: 78.2 fL (ref 78.0–100.0)
Platelets: 254 10*3/uL (ref 150–400)
RBC: 4.96 MIL/uL (ref 3.87–5.11)
RDW: 13.8 % (ref 11.5–15.5)
WBC: 12.7 10*3/uL — ABNORMAL HIGH (ref 4.0–10.5)

## 2016-06-23 SURGERY — INSERTION, INTRAMEDULLARY ROD, TIBIA
Anesthesia: General | Laterality: Right

## 2016-06-23 MED ORDER — HYDROMORPHONE HCL 1 MG/ML IJ SOLN
INTRAMUSCULAR | Status: AC
  Filled 2016-06-23: qty 0.5

## 2016-06-23 MED ORDER — KETOROLAC TROMETHAMINE 15 MG/ML IJ SOLN
INTRAMUSCULAR | Status: AC
  Filled 2016-06-23: qty 1

## 2016-06-23 MED ORDER — ROCURONIUM BROMIDE 10 MG/ML (PF) SYRINGE
PREFILLED_SYRINGE | INTRAVENOUS | Status: AC
  Filled 2016-06-23: qty 5

## 2016-06-23 MED ORDER — LACTATED RINGERS IV SOLN: INTRAVENOUS | Status: DC

## 2016-06-23 MED ORDER — PHENYLEPHRINE HCL 10 MG/ML IJ SOLN
INTRAMUSCULAR | Status: DC | PRN
  Administered 2016-06-23: 50 ug/min via INTRAVENOUS

## 2016-06-23 MED ORDER — PHENYLEPHRINE HCL 10 MG/ML IJ SOLN
INTRAMUSCULAR | Status: DC | PRN
  Administered 2016-06-23 (×4): 80 ug via INTRAVENOUS

## 2016-06-23 MED ORDER — CHLORHEXIDINE GLUCONATE 4 % EX LIQD: 60.0000 mL | Freq: Once | CUTANEOUS | Status: DC

## 2016-06-23 MED ORDER — 0.9 % SODIUM CHLORIDE (POUR BTL) OPTIME
TOPICAL | Status: DC | PRN
  Administered 2016-06-23: 1000 mL

## 2016-06-23 MED ORDER — KETOROLAC TROMETHAMINE 15 MG/ML IJ SOLN
15.0000 mg | Freq: Four times a day (QID) | INTRAMUSCULAR | Status: AC
  Administered 2016-06-23 – 2016-06-24 (×4): 15 mg via INTRAVENOUS
  Filled 2016-06-23 (×3): qty 1

## 2016-06-23 MED ORDER — ONDANSETRON HCL 4 MG/2ML IJ SOLN
4.0000 mg | Freq: Four times a day (QID) | INTRAMUSCULAR | Status: DC | PRN
  Administered 2016-06-24: 4 mg via INTRAVENOUS
  Filled 2016-06-23: qty 2

## 2016-06-23 MED ORDER — MIDAZOLAM HCL 2 MG/2ML IJ SOLN
INTRAMUSCULAR | Status: AC
  Filled 2016-06-23: qty 2

## 2016-06-23 MED ORDER — PROPOFOL 10 MG/ML IV BOLUS
INTRAVENOUS | Status: DC | PRN
  Administered 2016-06-23: 130 mg via INTRAVENOUS

## 2016-06-23 MED ORDER — PROPOFOL 10 MG/ML IV BOLUS
INTRAVENOUS | Status: AC
  Filled 2016-06-23: qty 20

## 2016-06-23 MED ORDER — POVIDONE-IODINE 10 % EX SWAB: 2.0000 "application " | Freq: Once | CUTANEOUS | Status: DC

## 2016-06-23 MED ORDER — DEXTROSE 5 % IV SOLN
3.0000 g | INTRAVENOUS | Status: AC
  Administered 2016-06-23: 3 g via INTRAVENOUS
  Filled 2016-06-23 (×2): qty 3000

## 2016-06-23 MED ORDER — FENTANYL CITRATE (PF) 250 MCG/5ML IJ SOLN
INTRAMUSCULAR | Status: AC
  Filled 2016-06-23: qty 5

## 2016-06-23 MED ORDER — SUGAMMADEX SODIUM 200 MG/2ML IV SOLN
INTRAVENOUS | Status: DC | PRN
  Administered 2016-06-23: 200 mg via INTRAVENOUS

## 2016-06-23 MED ORDER — FENTANYL CITRATE (PF) 100 MCG/2ML IJ SOLN
50.0000 ug | Freq: Once | INTRAMUSCULAR | Status: AC
  Administered 2016-06-23: 50 ug via INTRAVENOUS
  Filled 2016-06-23: qty 2

## 2016-06-23 MED ORDER — MIDAZOLAM HCL 5 MG/5ML IJ SOLN
INTRAMUSCULAR | Status: DC | PRN
  Administered 2016-06-23: 2 mg via INTRAVENOUS

## 2016-06-23 MED ORDER — OXYCODONE HCL 5 MG PO TABS
5.0000 mg | ORAL_TABLET | Freq: Once | ORAL | Status: AC | PRN
  Administered 2016-06-23: 5 mg via ORAL

## 2016-06-23 MED ORDER — OXYCODONE HCL 5 MG/5ML PO SOLN: 5.0000 mg | Freq: Once | ORAL | Status: AC | PRN

## 2016-06-23 MED ORDER — LIDOCAINE 2% (20 MG/ML) 5 ML SYRINGE
INTRAMUSCULAR | Status: AC
  Filled 2016-06-23: qty 5

## 2016-06-23 MED ORDER — LIDOCAINE HCL (CARDIAC) 20 MG/ML IV SOLN
INTRAVENOUS | Status: DC | PRN
  Administered 2016-06-23: 60 mg via INTRAVENOUS

## 2016-06-23 MED ORDER — ROCURONIUM BROMIDE 100 MG/10ML IV SOLN
INTRAVENOUS | Status: DC | PRN
  Administered 2016-06-23: 60 mg via INTRAVENOUS

## 2016-06-23 MED ORDER — PHENYLEPHRINE 40 MCG/ML (10ML) SYRINGE FOR IV PUSH (FOR BLOOD PRESSURE SUPPORT)
PREFILLED_SYRINGE | INTRAVENOUS | Status: AC
  Filled 2016-06-23: qty 10

## 2016-06-23 MED ORDER — LACTATED RINGERS IV SOLN
INTRAVENOUS | Status: DC | PRN
  Administered 2016-06-23 (×2): via INTRAVENOUS

## 2016-06-23 MED ORDER — ONDANSETRON HCL 4 MG/2ML IJ SOLN
INTRAMUSCULAR | Status: DC | PRN
  Administered 2016-06-23: 4 mg via INTRAVENOUS

## 2016-06-23 MED ORDER — HYDROMORPHONE HCL 1 MG/ML IJ SOLN
0.2500 mg | INTRAMUSCULAR | Status: DC | PRN
  Administered 2016-06-23 (×3): 0.5 mg via INTRAVENOUS

## 2016-06-23 MED ORDER — FENTANYL CITRATE (PF) 100 MCG/2ML IJ SOLN
50.0000 ug | INTRAMUSCULAR | Status: DC | PRN
  Administered 2016-06-23: 100 ug via INTRAVENOUS
  Administered 2016-06-23: 50 ug via INTRAVENOUS
  Administered 2016-06-23: 100 ug via INTRAVENOUS
  Filled 2016-06-23: qty 2

## 2016-06-23 MED ORDER — OXYCODONE HCL 5 MG PO TABS
ORAL_TABLET | ORAL | Status: AC
  Filled 2016-06-23: qty 1

## 2016-06-23 MED ORDER — HYDROMORPHONE HCL 1 MG/ML IJ SOLN
0.5000 mg | INTRAMUSCULAR | Status: DC | PRN
  Administered 2016-06-23 (×2): 0.5 mg via INTRAVENOUS
  Filled 2016-06-23 (×2): qty 1

## 2016-06-23 SURGICAL SUPPLY — 56 items
ALCOHOL ISOPROPYL (RUBBING) (MISCELLANEOUS) ×2 IMPLANT
BANDAGE ELASTIC 6 VELCRO ST LF (GAUZE/BANDAGES/DRESSINGS) ×2 IMPLANT
BIT DRILL 3.8X6 NS (BIT) ×2 IMPLANT
BIT DRILL 4.4 NS (BIT) ×2 IMPLANT
BNDG COHESIVE 4X5 TAN STRL (GAUZE/BANDAGES/DRESSINGS) ×2 IMPLANT
BNDG COHESIVE 6X5 TAN STRL LF (GAUZE/BANDAGES/DRESSINGS) ×2 IMPLANT
BRUSH SCRUB EZ  4% CHG (MISCELLANEOUS) ×2
BRUSH SCRUB EZ 4% CHG (MISCELLANEOUS) ×2 IMPLANT
CHLORAPREP W/TINT 26ML (MISCELLANEOUS) IMPLANT
COVER PERINEAL POST (MISCELLANEOUS) IMPLANT
COVER SURGICAL LIGHT HANDLE (MISCELLANEOUS) ×2 IMPLANT
DERMABOND ADVANCED (GAUZE/BANDAGES/DRESSINGS) ×2
DERMABOND ADVANCED .7 DNX12 (GAUZE/BANDAGES/DRESSINGS) ×2 IMPLANT
DRAPE C-ARM 42X72 X-RAY (DRAPES) ×2 IMPLANT
DRAPE C-ARMOR (DRAPES) ×2 IMPLANT
DRAPE EXTREMITY T 121X128X90 (DRAPE) ×2 IMPLANT
DRAPE IMP U-DRAPE 54X76 (DRAPES) ×4 IMPLANT
DRAPE STERI IOBAN 125X83 (DRAPES) ×2 IMPLANT
DRAPE U-SHAPE 47X51 STRL (DRAPES) ×4 IMPLANT
DRAPE UNIVERSAL PACK (DRAPES) ×2 IMPLANT
DRSG MEPILEX BORDER 4X4 (GAUZE/BANDAGES/DRESSINGS) ×2 IMPLANT
DRSG MEPILEX BORDER 4X8 (GAUZE/BANDAGES/DRESSINGS) ×2 IMPLANT
DRSG PAD ABDOMINAL 8X10 ST (GAUZE/BANDAGES/DRESSINGS) ×2 IMPLANT
ELECT REM PT RETURN 9FT ADLT (ELECTROSURGICAL) ×2
ELECTRODE REM PT RTRN 9FT ADLT (ELECTROSURGICAL) ×1 IMPLANT
FACESHIELD WRAPAROUND (MASK) ×2 IMPLANT
GLOVE BIO SURGEON STRL SZ8.5 (GLOVE) ×4 IMPLANT
GLOVE BIOGEL PI IND STRL 8.5 (GLOVE) ×1 IMPLANT
GLOVE BIOGEL PI INDICATOR 8.5 (GLOVE) ×1
GOWN STRL REUS W/ TWL LRG LVL3 (GOWN DISPOSABLE) ×2 IMPLANT
GOWN STRL REUS W/TWL 2XL LVL3 (GOWN DISPOSABLE) ×2 IMPLANT
GOWN STRL REUS W/TWL LRG LVL3 (GOWN DISPOSABLE) ×2
GUIDEWIRE BALL NOSE 80CM (WIRE) ×2 IMPLANT
KIT ROOM TURNOVER OR (KITS) ×2 IMPLANT
MANIFOLD NEPTUNE II (INSTRUMENTS) ×2 IMPLANT
MARKER SKIN DUAL TIP RULER LAB (MISCELLANEOUS) ×2 IMPLANT
NAIL TIBIAL 10MMX36CM (Nail) ×2 IMPLANT
NS IRRIG 1000ML POUR BTL (IV SOLUTION) ×2 IMPLANT
PACK GENERAL/GYN (CUSTOM PROCEDURE TRAY) ×2 IMPLANT
PAD ARMBOARD 7.5X6 YLW CONV (MISCELLANEOUS) ×4 IMPLANT
PADDING CAST ABS 4INX4YD NS (CAST SUPPLIES) ×1
PADDING CAST ABS COTTON 4X4 ST (CAST SUPPLIES) ×1 IMPLANT
PIN GUIDE 3.2 903003004 (MISCELLANEOUS) ×2 IMPLANT
SCREW ACECAP 40MM (Screw) ×2 IMPLANT
SCREW ACECAP 42MM (Screw) ×2 IMPLANT
SCREW ACECAP 44MM (Screw) ×2 IMPLANT
SCREW PROXIMAL DEPUY (Screw) ×2 IMPLANT
SCREW PRXML FT 40X5.5XNS LF (Screw) ×1 IMPLANT
SCREW PRXML FT 60X5.5XNS LF (Screw) ×1 IMPLANT
STOCKINETTE IMPERVIOUS 9X36 MD (GAUZE/BANDAGES/DRESSINGS) ×2 IMPLANT
SUT MNCRL AB 3-0 PS2 27 (SUTURE) ×2 IMPLANT
SUT MON AB 2-0 CT1 27 (SUTURE) ×2 IMPLANT
SUT VIC AB 1 CT1 27 (SUTURE) ×1
SUT VIC AB 1 CT1 27XBRD ANBCTR (SUTURE) ×1 IMPLANT
TOWEL OR 17X24 6PK STRL BLUE (TOWEL DISPOSABLE) ×2 IMPLANT
TOWEL OR 17X26 10 PK STRL BLUE (TOWEL DISPOSABLE) ×2 IMPLANT

## 2016-06-23 NOTE — H&P (Signed)
History and Physical    [name] [name] GQQ:[id number] DOB: November 11, 1965 DOA: 06/23/2016  PCP: Bjorn Pippin Medical Consultants:  None Patient coming from: home - lives with husband and daughter; NOK: husband, [phone]  Chief Complaint: Right tib-fib fracture  HPI: [name] [name] is a 51 y.o. female with medical history significant of HTN, HLD, DM, chronic back pain, remote DVT (in her 54s, idiopathic by her description) and diabetic retinopathy (for which she has been instructed to use caution with blood thinners) presenting after a mechanical fall today.  Patient works from home for [employer].  Was having lunch and went to take the dogs out.  She closed the gate and spun around and slipped on the wet grass.  She heard a pop in her right leg.  She crawled to the house and called her daughter to seek help.    Currently with some pain in her right leg.  Otherwise no complaints.   ED Course:   Closed deformity of right lower leg from fall, neurovascularly intact, no compartment syndrome.  Given Fentanyl and Dilaudid.  Dr. Delfino Lovett was able to quickly take the patient to the OR and [facility] was consulted for admission post-operatively.  Review of Systems: As per HPI; otherwise review of systems reviewed and negative.   Ambulatory Status:  Ambulates without assistance  Past Medical History:  Diagnosis Date  . Chronic back pain   . Depression   . Diabetes mellitus    type 2 uncontrolled  . Head ache    daily  . History of DVT (deep vein thrombosis) 2000  . Hyperlipidemia   . Hypertension   . Inflammatory bowel disease     Past Surgical History:  Procedure Laterality Date  . ABDOMINAL HYSTERECTOMY  1990  . OOPHORECTOMY  2008  . PELVIC LAPAROSCOPY     for endometriosis  . SPINE SURGERY  03/2008   Dr Marcial Pacas    Social History   Social History  . Marital status: Married    Spouse name: N/A  . Number of children: 2  . Years of education: N/A   Occupational History  . [place] History Main Topics  . Smoking status: Never Smoker  . Smokeless tobacco: Never Used  . Alcohol use No  . Drug use: No  . Sexual activity: Not on file   Other Topics Concern  . Not on file   Social History Narrative  . No narrative on file    No Known Allergies  Family History  Problem Relation Age of Onset  . Alcohol abuse Other   . Arthritis Other   . Cancer Other     breast, ovarian,prostate  . Heart disease Other     CAd  . Diabetes Other   . Hyperlipidemia Other   . Hypertension Other   . Kidney disease Other   . Stroke Other   . Colon cancer Maternal Aunt   . Colon cancer Maternal Uncle   . Colon cancer Paternal Grandfather   . Heart disease Mother   . Diabetes Mother   . Heart disease Father   . Diabetes Father     Prior to Admission medications   Medication Sig Start Date End Date Taking? Authorizing Provider  glucose blood (ONETOUCH VERIO) test strip Use as instructed to check blood sugar 4 times per day dx code 250.02 05/28/13   Elayne Snare, MD  insulin aspart (NOVOLOG FLEXPEN) 100 UNIT/ML FlexPen Inject 10 units twice a  day 09/25/13   Elayne Snare, MD  insulin regular human CONCENTRATED (HUMULIN R) 500 UNIT/ML SOLN injection Inject 20 Units into the skin as directed. 15-15-8 daily    Historical Provider, MD  lisinopril (PRINIVIL,ZESTRIL) 40 MG tablet Take 1 tablet (40 mg total) by mouth daily. 11/06/13   Elayne Snare, MD  [facility] DELICA LANCETS FINE MISC Use to obtain a blood specimen 4 times per day dx code 250.02 05/28/13   Elayne Snare, MD  pravastatin (PRAVACHOL) 40 MG tablet Take 1 tablet (40 mg total) by mouth daily. 09/28/13   Elayne Snare, MD    Physical Exam: Vitals:   06/23/16 2030 06/23/16 2045 06/23/16 2100 06/23/16 2111  BP: (!) 143/99 (!) 141/88 116/68 130/75  Pulse: 77 80 82 83  Resp: 17 19 (!) 26 10  Temp:   98.1 F (36.7 C)   SpO2: 100% 93% 100% 92%     General:  Appears calm and comfortable and is NAD,  somewhat somnolent Eyes:  EOMI, normal lids, iris ENT:  grossly normal hearing, lips & tongue, mmm Neck:  no LAD, masses or thyromegaly Cardiovascular:  RRR, no m/r/g. No LE edema.  Respiratory:  CTA bilaterally, no w/r/r. Normal respiratory effort. Abdomen:  soft, ntnd, NABS Skin:  no rash or induration seen on limited exam Musculoskeletal:  grossly normal tone BUE/BLE, good ROM, no bony abnormality.  The right leg is wrapped in a bandage from the mid-foot to the hip and there is a small amount of bright red blood pooling under the right calf.  There does appear to be good capillary refill to the right toes with a palpable dorsalis pedis pulse but the posterior tibial cannot be palpated because it causes significant pain with even light palpation in that area. Psychiatric:  grossly normal mood and affect, speech fluent and appropriate, AOx3 Neurologic:  CN 2-12 grossly intact, moves all extremities in coordinated fashion (right leg was not moved for obvious reasons), sensation intact  Labs on Admission: I have personally reviewed following labs and imaging studies  CBC:  Recent Labs Lab 06/23/16 1648  WBC 12.7*  HGB 12.9  HCT 38.8  MCV 78.2  PLT 956   Basic Metabolic Panel:  Recent Labs Lab 06/23/16 1648  NA 138  K 3.4*  CL 103  CO2 26  GLUCOSE 112*  BUN 16  CREATININE 0.70  CALCIUM 8.8*   GFR: CrCl cannot be calculated (Unknown ideal weight.). Liver Function Tests: No results for input(s): AST, ALT, ALKPHOS, BILITOT, PROT, ALBUMIN in the last 168 hours. No results for input(s): LIPASE, AMYLASE in the last 168 hours. No results for input(s): AMMONIA in the last 168 hours. Coagulation Profile: No results for input(s): INR, PROTIME in the last 168 hours. Cardiac Enzymes: No results for input(s): CKTOTAL, CKMB, CKMBINDEX, TROPONINI in the last 168 hours. BNP (last 3 results) No results for input(s): PROBNP in the last 8760 hours. HbA1C: No results for input(s):  HGBA1C in the last 72 hours. CBG:  Recent Labs Lab 06/23/16 1728 06/23/16 2000  GLUCAP 97 123*   Lipid Profile: No results for input(s): CHOL, HDL, LDLCALC, TRIG, CHOLHDL, LDLDIRECT in the last 72 hours. Thyroid Function Tests: No results for input(s): TSH, T4TOTAL, FREET4, T3FREE, THYROIDAB in the last 72 hours. Anemia Panel: No results for input(s): VITAMINB12, FOLATE, FERRITIN, TIBC, IRON, RETICCTPCT in the last 72 hours. Urine analysis:    Component Value Date/Time   COLORURINE YELLOW 05/09/2013 1631   APPEARANCEUR CLEAR 05/09/2013 1631   LABSPEC  1.021 05/09/2013 1631   PHURINE 6.0 05/09/2013 1631   GLUCOSEU 250 (A) 05/09/2013 1631   GLUCOSEU NEG mg/dL 02/13/2009 1819   HGBUR NEG 05/09/2013 1631   BILIRUBINUR NEG 05/09/2013 1631   KETONESUR NEG 05/09/2013 1631   PROTEINUR NEG 05/09/2013 1631   UROBILINOGEN 0.2 05/09/2013 1631   NITRITE NEG 05/09/2013 1631   LEUKOCYTESUR NEG 05/09/2013 1631    Creatinine Clearance: CrCl cannot be calculated (Unknown ideal weight.).  Sepsis Labs: @LABRCNTIP (procalcitonin:4,lacticidven:4) )No results found for this or any previous visit (from the past 240 hour(s)).   Radiological Exams on Admission: Dg Tibia/fibula Right  Result Date: 06/23/2016 CLINICAL DATA:  ORIF of right tibial fracture EXAM: RIGHT TIBIA AND FIBULA - 2 VIEW; DG C-ARM 61-120 MIN COMPARISON:  Films from earlier in the same day FLUOROSCOPY TIME:  Fluoroscopy Time:  3 minutes 25 seconds Radiation Exposure Index (if provided by the fluoroscopic device): Not available Number of Acquired Spot Images: 7 FINDINGS: Medullary rod is noted in the right tibia with both proximal and distal fixation screws. Fracture fragments are in anatomic alignment. The fibular fracture is in near anatomic alignment with only mild displacement at the fracture site. IMPRESSION: ORIF of right tibial fracture. Electronically Signed   By: Inez Catalina M.D.   On: 06/23/2016 19:56   Dg Tibia/fibula  Right  Result Date: 06/23/2016 CLINICAL DATA:  Right leg deformity status post fall. EXAM: RIGHT TIBIA AND FIBULA - 2 VIEW COMPARISON:  None. FINDINGS: Oblique fracture of the mid -distal right tibial diaphysis with 10 mm of lateral displacement and 6 mm of anterior displacement. Mild apex anterior angulation. Oblique displaced fracture of the distal fibular diaphysis with a 10 mm of lateral displacement and 13 mm of posterior displacement. No other fracture or dislocation.  Ankle mortise is intact. Soft tissue swelling around the fracture site. IMPRESSION: 1. Oblique fracture of the mid -distal right tibial diaphysis with 10 mm of lateral displacement and 6 mm of anterior displacement. 2. Oblique displaced fracture of the distal fibular diaphysis with a 10 mm of lateral displacement and 13 mm of posterior displacement. Electronically Signed   By: Kathreen Devoid   On: 06/23/2016 15:19   Dg Tibia/fibula Right Port  Result Date: 06/23/2016 CLINICAL DATA:  Status post ORIF of right tibial fracture EXAM: PORTABLE RIGHT TIBIA AND FIBULA - 2 VIEW COMPARISON:  06/23/2016 FINDINGS: Medullary rod is noted within the tibia with distal and proximal fixation screws. The fracture fragments are in anatomic alignment. Near anatomic alignment of the fibular fracture is seen. IMPRESSION: Status post ORIF of right tibial fracture. Electronically Signed   By: Inez Catalina M.D.   On: 06/23/2016 20:37   Dg C-arm 1-60 Min  Result Date: 06/23/2016 CLINICAL DATA:  ORIF of right tibial fracture EXAM: RIGHT TIBIA AND FIBULA - 2 VIEW; DG C-ARM 61-120 MIN COMPARISON:  Films from earlier in the same day FLUOROSCOPY TIME:  Fluoroscopy Time:  3 minutes 25 seconds Radiation Exposure Index (if provided by the fluoroscopic device): Not available Number of Acquired Spot Images: 7 FINDINGS: Medullary rod is noted in the right tibia with both proximal and distal fixation screws. Fracture fragments are in anatomic alignment. The fibular fracture is  in near anatomic alignment with only mild displacement at the fracture site. IMPRESSION: ORIF of right tibial fracture. Electronically Signed   By: Inez Catalina M.D.   On: 06/23/2016 19:56    EKG: Independently reviewed.  NSR with rate 90; nonspecific ST changes with no evidence  of acute ischemia  Assessment/Plan Principal Problem:   Displaced comminuted fracture of shaft of right tibia, initial encounter for closed fracture Active Problems:   Diabetes mellitus type 2 in obese (HCC)   Type 2 diabetes mellitus with hyperlipidemia (HCC)   Essential hypertension   Tib/fib fracture -Mechanical fall resulting in right tib/fib fracture -Orthopedics consulted -Patient is now s/p surgical repair, ORIF -SCD on the left leg overnight, start Lovenox post-operatively (or as per ortho) -Pain control with Vicodin and Dilaudid as per ortho -PT/OT consults requested -Of note, patient does have a 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 and so is likely to need Lovenox until she is appropriately mobile -However, she also has a h/o diabetic retinopathy and has been told to avoid blood thinners, if possible -Short-term Lovenox should be acceptable, as the benefit appears to outweigh the risks  DM -Glucose 97, 123 -No recent notes/orders in Epic -She had suboptimal DM control as of 2015 -Appears to have improved control at this time -Does not appear to be taking long-acting insulin - just Aspart and regular insulin -Will cover with SSI  HTN -Continue lisinopril -Monitor BP  HLD -Continue Pravachol -Defer to PCP   DVT prophylaxis: SCD until approved for Lovenox by orthopedics Code Status:  Full - confirmed with patient/family Family Communication: Husband and daughter were present throughout evaluation  Disposition Plan:  Home once clinically improved Consults called: Orthopedics; PT/OT  Admission status: Admit - It is my clinical opinion that admission to INPATIENT is reasonable and  necessary because this patient will require at least 2 midnights in the hospital to treat this condition based on the medical complexity of the problems presented.  Given the aforementioned information, the predictability of an adverse outcome is felt to be significant.    Karmen Bongo MD [facility]  If 7PM-7AM, please contact night-coverage [email or web address] Password [email or web address]  06/23/2016, 10:20 PM

## 2016-06-23 NOTE — ED Provider Notes (Addendum)
[facility] DEPT Provider Note   CSN: [id number] Arrival date & time: 06/23/16  1416     History   Chief Complaint Chief Complaint  Patient presents with  . Leg Injury    HPI Quintessa Czarnecki is a 51 y.o. female.  51yo F w/ PMH including IDDM, HTn, HLD who p/w R leg pain. Just prior to arrival, the patient was walking in her yard and slipped on wet grass, twisting her right leg and falling. She felt a pop and had an immediate onset of severe right lower leg pain. The pain is constant and currently 8/10 in intensity. She denies any other injuries and did not hit her head or lose consciousness. She has had some tingling of her foot but denies any severe numbness. No anticoagulant use.   The history is provided by the patient.    Past Medical History:  Diagnosis Date  . Chronic back pain   . Depression   . Diabetes mellitus    type 2 uncontrolled  . Head ache    daily  . History of DVT (deep vein thrombosis) 2000  . Hyperlipidemia   . Hypertension   . Inflammatory bowel disease     Patient Active Problem List   Diagnosis Date Noted  . Elevated ALT measurement 05/09/2013  . Ear pain 09/26/2011  . Chest pain 05/27/2011  . LOW BACK PAIN, CHRONIC 06/02/2009  . HYPERGLYCEMIA 03/07/2009  . DIABETES MELLITUS, TYPE II, UNCONTROLLED 02/20/2009  . PALPITATIONS 02/20/2009  . CHEST PAIN 02/20/2009  . HYPERLIPIDEMIA 10/29/2008  . DEPRESSION 10/29/2008  . HYPERTENSION 10/29/2008  . HEADACHE 10/29/2008    Past Surgical History:  Procedure Laterality Date  . ABDOMINAL HYSTERECTOMY  1990  . OOPHORECTOMY  2008  . PELVIC LAPAROSCOPY     for endometriosis  . [facility]  03/2008   Dr Marcial Pacas    OB History    No data available       Home Medications    Prior to Admission medications   Medication Sig Start Date End Date Taking? Authorizing Provider  glucose blood (ONETOUCH VERIO) test strip Use as instructed to check blood sugar 4 times per day dx code 250.02 05/28/13   Elayne Snare, MD  insulin aspart (NOVOLOG FLEXPEN) 100 UNIT/ML FlexPen Inject 10 units twice a day 09/25/13   Elayne Snare, MD  insulin regular human CONCENTRATED (HUMULIN R) 500 UNIT/ML SOLN injection Inject 20 Units into the skin as directed. 15-15-8 daily    Historical Provider, MD  lisinopril (PRINIVIL,ZESTRIL) 40 MG tablet Take 1 tablet (40 mg total) by mouth daily. 11/06/13   Elayne Snare, MD  [facility] DELICA LANCETS FINE MISC Use to obtain a blood specimen 4 times per day dx code 250.02 05/28/13   Elayne Snare, MD  pravastatin (PRAVACHOL) 40 MG tablet Take 1 tablet (40 mg total) by mouth daily. 09/28/13   Elayne Snare, MD    Family History Family History  Problem Relation Age of Onset  . Alcohol abuse Other   . Arthritis Other   . Cancer Other     breast, ovarian,prostate  . Heart disease Other     CAd  . Diabetes Other   . Hyperlipidemia Other   . Hypertension Other   . Kidney disease Other   . Stroke Other   . Colon cancer Maternal Aunt   . Colon cancer Maternal Uncle   . Colon cancer Paternal Grandfather   . Heart disease Mother   . Diabetes Mother   .  Heart disease Father   . Diabetes Father     Social History Social History  Substance Use Topics  . Smoking status: Never Smoker  . Smokeless tobacco: Never Used  . Alcohol use No     Allergies   Patient has no known allergies.   Review of Systems Review of Systems   Physical Exam Updated Vital Signs BP 120/71   Pulse 84   SpO2 90%   Physical Exam  Constitutional: She is oriented to person, place, and time. She appears well-developed and well-nourished. No distress.  Uncomfortable  HENT:  Head: Normocephalic and atraumatic.  Moist mucous membranes  Eyes: Conjunctivae are normal. Pupils are equal, round, and reactive to light.  Neck: Neck supple.  Cardiovascular: Normal rate, regular rhythm, normal heart sounds and intact distal pulses.   No murmur heard. Pulmonary/Chest: Effort normal and breath sounds normal.    Abdominal: Soft. Bowel sounds are normal. She exhibits no distension. There is no tenderness.  Musculoskeletal: She exhibits edema and deformity.  Edema and closed deformity of right mid lower leg, compartments soft but diffuse tenderness to palpation; no tenderness of the knee, ankle, or foot; normal sensation toes  Neurological: She is alert and oriented to person, place, and time.  Fluent speech  Skin: Skin is warm and dry.  Psychiatric: She has a normal mood and affect. Judgment normal.  Nursing note and vitals reviewed.    ED Treatments / Results  Labs (all labs ordered are listed, but only abnormal results are displayed) Labs Reviewed  BASIC METABOLIC PANEL - Abnormal; Notable for the following:       Result Value   Potassium 3.4 (*)    Glucose, Bld 112 (*)    Calcium 8.8 (*)    All other components within normal limits  CBC - Abnormal; Notable for the following:    WBC 12.7 (*)    All other components within normal limits  GLUCOSE, CAPILLARY  HEMOGLOBIN A1C  I-STAT CHEM 8, ED    EKG  EKG Interpretation None       Radiology Dg Tibia/fibula Right  Result Date: 06/23/2016 CLINICAL DATA:  Right leg deformity status post fall. EXAM: RIGHT TIBIA AND FIBULA - 2 VIEW COMPARISON:  None. FINDINGS: Oblique fracture of the mid -distal right tibial diaphysis with 10 mm of lateral displacement and 6 mm of anterior displacement. Mild apex anterior angulation. Oblique displaced fracture of the distal fibular diaphysis with a 10 mm of lateral displacement and 13 mm of posterior displacement. No other fracture or dislocation.  Ankle mortise is intact. Soft tissue swelling around the fracture site. IMPRESSION: 1. Oblique fracture of the mid -distal right tibial diaphysis with 10 mm of lateral displacement and 6 mm of anterior displacement. 2. Oblique displaced fracture of the distal fibular diaphysis with a 10 mm of lateral displacement and 13 mm of posterior displacement. Electronically  Signed   By: Kathreen Devoid   On: 06/23/2016 15:19    Procedures Procedures (including critical care time)  Medications Ordered in ED Medications  fentaNYL (SUBLIMAZE) injection 50 mcg ( Intravenous MAR Hold 06/23/16 1723)  HYDROmorphone (DILAUDID) injection 0.5 mg ( Intravenous MAR Hold 06/23/16 1723)  chlorhexidine (HIBICLENS) 4 % liquid 4 application (not administered)  povidone-iodine 10 % swab 2 application (not administered)  ceFAZolin (ANCEF) 3 g in dextrose 5 % 50 mL IVPB (not administered)  lactated ringers infusion (not administered)  fentaNYL (SUBLIMAZE) injection 50 mcg (50 mcg Intravenous Given 06/23/16 1447)  Initial Impression / Assessment and Plan / ED Course  I have reviewed the triage vital signs and the nursing notes.  Pertinent labs & imaging results that were available during my care of the patient were reviewed by me and considered in my medical decision making (see chart for details).     PT w/ R lower leg injury from mechanical fall. She had closed deformity of lower leg but was neurovascularly intact distally, no evidence of compartment syndrome on exam. No other external signs of injury and no other areas of pain. Gave the patient fentanyl followed by Dilaudid for pain and obtained plain films which show oblique fracture of the shaft of the tibia with displacement as well as oblique displaced fracture of distal fibular shaft. Discussed with orthopedics, Dr. Lyla Glassing, who will take patient to OR today. Because of patient's report of poorly controlled IDDM as well as h/o HTN and HLD, contacted [facility] for admission. Discussed with Dr. Allyson Sabal. Will call medicine team again after patient is in post-op.  Final Clinical Impressions(s) / ED Diagnoses   Final diagnoses:  Tibia/fibula fracture, right, closed, initial encounter    New Prescriptions Current Discharge Medication List       Sharlett Iles, MD 06/23/16 1806    Lalitha Ilyas, Wenda Overland,  MD 07/02/16 0221

## 2016-06-23 NOTE — ED Triage Notes (Signed)
Pt arrived via [employer] after falling while doing yard work. Pt stated she slipped on wet grass and twisted her leg, falling to the ground. Pt recalls hearing a "pop". Deformity and Swelling noted to the right lower leg. Pulses equal, bilaterally. CMS intact. 8mg  morphine en route by EMS. Pt has hx of DM with regular glucose of 400-600, per pt.

## 2016-06-23 NOTE — ED Provider Notes (Signed)
Medical Screening Exam: Patient examined prior to x-rays. Splint has been applied by paramedics. She has deformity of the junction of middle to distal third of tibia. No pressure tenting of the skin. Closed injury. She has decreased sensation the foot per her report from her diabetes. She has good cap refill and strong DP pulse.   Tanna Furry, MD 06/23/16 1452

## 2016-06-23 NOTE — Anesthesia Preprocedure Evaluation (Signed)
Anesthesia Evaluation  Patient identified by MRN, date of birth, ID band Patient awake    Reviewed: Allergy & Precautions, NPO status , Patient's Chart, lab work & pertinent test results  History of Anesthesia Complications Negative for: history of anesthetic complications  Airway Mallampati: II  TM Distance: >3 FB Neck ROM: Full    Dental  (+) Teeth Intact   Pulmonary neg pulmonary ROS,    breath sounds clear to auscultation       Cardiovascular hypertension,  Rhythm:Regular     Neuro/Psych  Headaches, PSYCHIATRIC DISORDERS Depression    GI/Hepatic negative GI ROS, Neg liver ROS,   Endo/Other  diabetes, Poorly Controlled, Type 2, Insulin Dependent  Renal/GU negative Renal ROS     Musculoskeletal   Abdominal   Peds  Hematology   Anesthesia Other Findings   Reproductive/Obstetrics                             Anesthesia Physical Anesthesia Plan  ASA: III  Anesthesia Plan: General   Post-op Pain Management:    Induction: Intravenous  Airway Management Planned: Oral ETT  Additional Equipment: None  Intra-op Plan:   Post-operative Plan: Extubation in OR  Informed Consent: I have reviewed the patients History and Physical, chart, labs and discussed the procedure including the risks, benefits and alternatives for the proposed anesthesia with the patient or authorized representative who has indicated his/her understanding and acceptance.   Dental advisory given  Plan Discussed with: CRNA and Surgeon  Anesthesia Plan Comments: (Hgb A1C 13 with recent change to insulin, BG 98 today, intraop BG monitoring planned )        Anesthesia Quick Evaluation

## 2016-06-23 NOTE — ED Notes (Signed)
Patient transported to X-ray 

## 2016-06-23 NOTE — Discharge Instructions (Signed)
Touch Down Weight Bearing right leg. Keep dressing clean and dry. Do not remove.

## 2016-06-23 NOTE — Transfer of Care (Signed)
Immediate Anesthesia Transfer of Care Note  Patient: [name] [name]  Procedure(s) Performed: Procedure(s): INTRAMEDULLARY (IM) NAIL TIBIAL (Right)  Patient Location: PACU  Anesthesia Type:General  Level of Consciousness: awake  Airway & Oxygen Therapy: Patient Spontanous Breathing  Post-op Assessment: Report given to RN and Post -op Vital signs reviewed and stable  Post vital signs: Reviewed and stable  Last Vitals:  Vitals:   06/23/16 1700 06/23/16 2000  BP: 120/71 131/72  Pulse: 84 87  Resp:  15  Temp:  36.4 C    Last Pain:  Vitals:   06/23/16 1529  PainSc: 0-No pain         Complications: No apparent anesthesia complications

## 2016-06-23 NOTE — Consult Note (Signed)
ORTHOPAEDIC CONSULTATION  REQUESTING PHYSICIAN: No att. providers found  PCP:  No PCP Per Patient  Chief Complaint: Right tib-fib fracture  HPI: [name] [name] is a 51 y.o. female who complains of  right lower leg injury. She was walking her dogs earlier today, when she slipped and fell on the wet grass. She heard a pop. She had pain and deformity in the right lower leg. She was brought to the emergency department at [facility], where x-rays revealed a distal third tib-fib shaft fracture. Orthopedic consultation was placed for management of the fracture. Hospitalist consult was obtained due to uncontrolled diabetes.  Past Medical History:  Diagnosis Date  . Chronic back pain   . Depression   . Diabetes mellitus    type 2 uncontrolled  . Head ache    daily  . History of DVT (deep vein thrombosis) 2000  . Hyperlipidemia   . Hypertension   . Inflammatory bowel disease    Past Surgical History:  Procedure Laterality Date  . ABDOMINAL HYSTERECTOMY  1990  . OOPHORECTOMY  2008  . PELVIC LAPAROSCOPY     for endometriosis  . SPINE SURGERY  03/2008   Dr Marcial Pacas   Social History   Social History  . Marital status: Married    Spouse name: N/A  . Number of children: 2  . Years of education: N/A   Occupational History  . [place] History Main Topics  . Smoking status: Never Smoker  . Smokeless tobacco: Never Used  . Alcohol use No  . Drug use: No  . Sexual activity: Not Asked   Other Topics Concern  . None   Social History Narrative  . None   Family History  Problem Relation Age of Onset  . Alcohol abuse Other   . Arthritis Other   . Cancer Other     breast, ovarian,prostate  . Heart disease Other     CAd  . Diabetes Other   . Hyperlipidemia Other   . Hypertension Other   . Kidney disease Other   . Stroke Other   . Colon cancer Maternal Aunt   . Colon cancer Maternal Uncle   . Colon cancer Paternal Grandfather   . Heart  disease Mother   . Diabetes Mother   . Heart disease Father   . Diabetes Father    No Known Allergies Prior to Admission medications   Medication Sig Start Date End Date Taking? Authorizing Provider  glucose blood (ONETOUCH VERIO) test strip Use as instructed to check blood sugar 4 times per day dx code 250.02 05/28/13   Elayne Snare, MD  insulin aspart (NOVOLOG FLEXPEN) 100 UNIT/ML FlexPen Inject 10 units twice a day 09/25/13   Elayne Snare, MD  insulin regular human CONCENTRATED (HUMULIN R) 500 UNIT/ML SOLN injection Inject 20 Units into the skin as directed. 15-15-8 daily    Historical Provider, MD  lisinopril (PRINIVIL,ZESTRIL) 40 MG tablet Take 1 tablet (40 mg total) by mouth daily. 11/06/13   Elayne Snare, MD  [facility] DELICA LANCETS FINE MISC Use to obtain a blood specimen 4 times per day dx code 250.02 05/28/13   Elayne Snare, MD  pravastatin (PRAVACHOL) 40 MG tablet Take 1 tablet (40 mg total) by mouth daily. 09/28/13   Elayne Snare, MD   Dg Tibia/fibula Right  Result Date: 06/23/2016 CLINICAL DATA:  Right leg deformity status post fall. EXAM: RIGHT TIBIA AND FIBULA - 2 VIEW COMPARISON:  None. FINDINGS: Oblique fracture  of the mid -distal right tibial diaphysis with 10 mm of lateral displacement and 6 mm of anterior displacement. Mild apex anterior angulation. Oblique displaced fracture of the distal fibular diaphysis with a 10 mm of lateral displacement and 13 mm of posterior displacement. No other fracture or dislocation.  Ankle mortise is intact. Soft tissue swelling around the fracture site. IMPRESSION: 1. Oblique fracture of the mid -distal right tibial diaphysis with 10 mm of lateral displacement and 6 mm of anterior displacement. 2. Oblique displaced fracture of the distal fibular diaphysis with a 10 mm of lateral displacement and 13 mm of posterior displacement. Electronically Signed   By: Kathreen Devoid   On: 06/23/2016 15:19    Positive ROS: All other systems have been reviewed and were otherwise  negative with the exception of those mentioned in the HPI and as above.  Physical Exam: General: Alert, no acute distress Cardiovascular: No pedal edema Respiratory: No cyanosis, no use of accessory musculature GI: No organomegaly, abdomen is soft and non-tender Skin: No lesions in the area of chief complaint Neurologic: Sensation intact distally Psychiatric: Patient is competent for consent with normal mood and affect Lymphatic: No axillary or cervical lymphadenopathy  MUSCULOSKELETAL: Examination of the right lower extremity reveals that she has a traction splint in place. There are no skin wounds or lesions. She has moderate swelling. Her compartments are soft and compressible. She has active motor function dorsiflexion, plantarflexion, and great toe extension, with strength limited by pain. She has intact sensation to the superficial peroneal, deep peroneal, posterior tibial distributions. There is no pain with passive stretch.  Assessment: Right tib-fib fracture. No evidence of impending compartment syndrome. Uncontrolled diabetes  Plan: I discussed the findings with the patient and her family. I recommended intramedullary fixation of her right tibia fracture. We discussed risks, benefits, alternatives. Please see statement of risk. Her biggest risks include nonunion and wound complications due to her uncontrolled diabetes. There is also a risk of anterior knee pain due to having a tibia IM nail placed.  The risks, benefits, and alternatives were discussed with the patient. There are risks associated with the surgery including, but not limited to, problems with anesthesia (death), infection, differences in leg length/angulation/rotation, fracture of bones, loosening or failure of implants, malunion, nonunion, hematoma (blood accumulation) which may require surgical drainage, blood clots, pulmonary embolism, nerve injury (foot drop), and blood vessel injury. The patient understands these  risks and elects to proceed.   Micky Sheller, Horald Pollen, MD Cell [phone]    06/23/2016 5:45 PM

## 2016-06-23 NOTE — Anesthesia Procedure Notes (Signed)
Procedure Name: Intubation Date/Time: 06/23/2016 6:05 PM Performed by: Manus Gunning, Zohal Reny J Pre-anesthesia Checklist: Patient identified, Suction available, Emergency Drugs available, Patient being monitored and Timeout performed Patient Re-evaluated:Patient Re-evaluated prior to inductionOxygen Delivery Method: Circle system utilized Preoxygenation: Pre-oxygenation with 100% oxygen Intubation Type: IV induction Ventilation: Mask ventilation without difficulty Laryngoscope Size: Mac and 3 Grade View: Grade II Tube type: Oral Tube size: 7.0 mm Number of attempts: 1 Placement Confirmation: ETT inserted through vocal cords under direct vision,  positive ETCO2 and breath sounds checked- equal and bilateral Secured at: 21 cm Tube secured with: Tape Dental Injury: Teeth and Oropharynx as per pre-operative assessment

## 2016-06-23 NOTE — Progress Notes (Signed)
51yo F w/ PMH including IDDM hx of uncontrolled DM , HTn, HLD presented the ED after a fall. She was seen by EDP, found to have  1. Oblique fracture of the mid -distal right tibial diaphysis with 10 mm of lateral displacement and 6 mm of anterior displacement. 2. Oblique displaced fracture of the distal fibular diaphysis with a 10 mm of lateral displacement and 13 mm of posterior displacement  before she could be medically cleared or fully evaluated , she was taken swiftly to the OR   She will need full admission orders and H &P ,after she is post op  [place] TO ASSIGN THIS ADMISSION TO   [facility]

## 2016-06-23 NOTE — Brief Op Note (Signed)
06/23/2016  7:43 PM  PATIENT:  [name] [name]  51 y.o. female  PRE-OPERATIVE DIAGNOSIS:  Right Tibia Fracture  POST-OPERATIVE DIAGNOSIS:  Right Tibia Fracture  PROCEDURE:  Procedure(s): INTRAMEDULLARY (IM) NAIL TIBIAL (Right)  SURGEON:  Surgeon(s) and Role:    * Rod Can, MD - Primary  PHYSICIAN ASSISTANT:   ASSISTANTS: April green, rnfa   ANESTHESIA:   general  EBL:  Total I/O In: 1000 [I.V.:1000] Out: -   BLOOD ADMINISTERED:none  DRAINS: none   LOCAL MEDICATIONS USED:  NONE  SPECIMEN:  No Specimen  DISPOSITION OF SPECIMEN:  N/A  COUNTS:  YES  TOURNIQUET:  * No tourniquets in log *  DICTATION: .Other Dictation: Dictation Number [id number]  PLAN OF CARE: Admit to inpatient   PATIENT DISPOSITION:  PACU - hemodynamically stable.   Delay start of Pharmacological VTE agent (>24hrs) due to surgical blood loss or risk of bleeding: yes

## 2016-06-24 ENCOUNTER — Encounter (HOSPITAL_COMMUNITY): Payer: Self-pay | Admitting: Orthopedic Surgery

## 2016-06-24 DIAGNOSIS — I10 Essential (primary) hypertension: Secondary | ICD-10-CM

## 2016-06-24 DIAGNOSIS — E876 Hypokalemia: Secondary | ICD-10-CM

## 2016-06-24 DIAGNOSIS — S82201A Unspecified fracture of shaft of right tibia, initial encounter for closed fracture: Secondary | ICD-10-CM

## 2016-06-24 DIAGNOSIS — E1169 Type 2 diabetes mellitus with other specified complication: Secondary | ICD-10-CM

## 2016-06-24 DIAGNOSIS — E669 Obesity, unspecified: Secondary | ICD-10-CM

## 2016-06-24 DIAGNOSIS — E785 Hyperlipidemia, unspecified: Secondary | ICD-10-CM

## 2016-06-24 DIAGNOSIS — S82401A Unspecified fracture of shaft of right fibula, initial encounter for closed fracture: Secondary | ICD-10-CM

## 2016-06-24 LAB — GLUCOSE, CAPILLARY
Glucose-Capillary: 166 mg/dL — ABNORMAL HIGH (ref 65–99)
Glucose-Capillary: 215 mg/dL — ABNORMAL HIGH (ref 65–99)
Glucose-Capillary: 241 mg/dL — ABNORMAL HIGH (ref 65–99)

## 2016-06-24 LAB — CBC
HCT: 34.5 % — ABNORMAL LOW (ref 36.0–46.0)
Hemoglobin: 11.3 g/dL — ABNORMAL LOW (ref 12.0–15.0)
MCH: 26.3 pg (ref 26.0–34.0)
MCHC: 32.8 g/dL (ref 30.0–36.0)
MCV: 80.2 fL (ref 78.0–100.0)
Platelets: 209 10*3/uL (ref 150–400)
RBC: 4.3 MIL/uL (ref 3.87–5.11)
RDW: 14.4 % (ref 11.5–15.5)
WBC: 6.7 10*3/uL (ref 4.0–10.5)

## 2016-06-24 MED ORDER — ACETAMINOPHEN 325 MG PO TABS
650.0000 mg | ORAL_TABLET | Freq: Four times a day (QID) | ORAL | Status: DC | PRN
  Administered 2016-06-25: 650 mg via ORAL
  Filled 2016-06-24: qty 2

## 2016-06-24 MED ORDER — PRAVASTATIN SODIUM 40 MG PO TABS: 40.0000 mg | ORAL_TABLET | Freq: Every day | ORAL | Status: DC

## 2016-06-24 MED ORDER — PNEUMOCOCCAL VAC POLYVALENT 25 MCG/0.5ML IJ INJ: 0.5000 mL | INJECTION | INTRAMUSCULAR | Status: DC

## 2016-06-24 MED ORDER — SENNA 8.6 MG PO TABS
1.0000 | ORAL_TABLET | Freq: Two times a day (BID) | ORAL | Status: DC
  Filled 2016-06-24 (×2): qty 1

## 2016-06-24 MED ORDER — METHOCARBAMOL 500 MG PO TABS
500.0000 mg | ORAL_TABLET | Freq: Four times a day (QID) | ORAL | Status: DC | PRN
  Administered 2016-06-24: 500 mg via ORAL
  Filled 2016-06-24: qty 1

## 2016-06-24 MED ORDER — METHOCARBAMOL 1000 MG/10ML IJ SOLN
500.0000 mg | Freq: Four times a day (QID) | INTRAMUSCULAR | Status: DC | PRN
  Filled 2016-06-24: qty 5

## 2016-06-24 MED ORDER — ACETAMINOPHEN 650 MG RE SUPP: 650.0000 mg | Freq: Four times a day (QID) | RECTAL | Status: DC | PRN

## 2016-06-24 MED ORDER — POLYETHYLENE GLYCOL 3350 17 G PO PACK: 17.0000 g | PACK | Freq: Every day | ORAL | Status: DC | PRN

## 2016-06-24 MED ORDER — ONDANSETRON HCL 4 MG PO TABS
4.0000 mg | ORAL_TABLET | Freq: Four times a day (QID) | ORAL | Status: DC | PRN
  Administered 2016-06-24: 4 mg via ORAL
  Filled 2016-06-24: qty 1

## 2016-06-24 MED ORDER — ONDANSETRON HCL 4 MG/2ML IJ SOLN
4.0000 mg | Freq: Four times a day (QID) | INTRAMUSCULAR | Status: DC | PRN
  Administered 2016-06-24 – 2016-06-25 (×2): 4 mg via INTRAVENOUS
  Filled 2016-06-24 (×2): qty 2

## 2016-06-24 MED ORDER — DOCUSATE SODIUM 100 MG PO CAPS
100.0000 mg | ORAL_CAPSULE | Freq: Two times a day (BID) | ORAL | Status: DC
  Filled 2016-06-24 (×2): qty 1

## 2016-06-24 MED ORDER — SODIUM CHLORIDE 0.9 % IV SOLN
INTRAVENOUS | Status: DC
  Administered 2016-06-24 – 2016-06-25 (×2): via INTRAVENOUS

## 2016-06-24 MED ORDER — INSULIN ASPART 100 UNIT/ML ~~LOC~~ SOLN
0.0000 [IU] | Freq: Three times a day (TID) | SUBCUTANEOUS | Status: DC
  Administered 2016-06-24: 3 [IU] via SUBCUTANEOUS
  Administered 2016-06-24: 5 [IU] via SUBCUTANEOUS

## 2016-06-24 MED ORDER — HYDROMORPHONE HCL 1 MG/ML IJ SOLN
0.5000 mg | INTRAMUSCULAR | Status: DC | PRN
  Administered 2016-06-24 – 2016-06-25 (×4): 1 mg via INTRAVENOUS
  Filled 2016-06-24 (×4): qty 1

## 2016-06-24 MED ORDER — ATORVASTATIN CALCIUM 20 MG PO TABS
20.0000 mg | ORAL_TABLET | Freq: Every day | ORAL | Status: DC
  Administered 2016-06-24: 20 mg via ORAL
  Filled 2016-06-24: qty 1

## 2016-06-24 MED ORDER — ENOXAPARIN SODIUM 40 MG/0.4ML ~~LOC~~ SOLN
40.0000 mg | Freq: Every day | SUBCUTANEOUS | Status: DC
  Administered 2016-06-24 – 2016-06-25 (×2): 40 mg via SUBCUTANEOUS
  Filled 2016-06-24 (×2): qty 0.4

## 2016-06-24 MED ORDER — HYDROCODONE-ACETAMINOPHEN 5-325 MG PO TABS
1.0000 | ORAL_TABLET | ORAL | Status: DC | PRN
  Administered 2016-06-24 – 2016-06-25 (×3): 2 via ORAL
  Filled 2016-06-24 (×3): qty 2

## 2016-06-24 MED ORDER — CEFAZOLIN SODIUM-DEXTROSE 2-4 GM/100ML-% IV SOLN
2.0000 g | Freq: Four times a day (QID) | INTRAVENOUS | Status: AC
  Administered 2016-06-24 (×3): 2 g via INTRAVENOUS
  Filled 2016-06-24 (×3): qty 100

## 2016-06-24 MED ORDER — METOCLOPRAMIDE HCL 5 MG PO TABS
5.0000 mg | ORAL_TABLET | Freq: Three times a day (TID) | ORAL | Status: DC | PRN
  Administered 2016-06-24: 10 mg via ORAL
  Filled 2016-06-24: qty 2

## 2016-06-24 MED ORDER — METOCLOPRAMIDE HCL 5 MG/ML IJ SOLN: 5.0000 mg | Freq: Three times a day (TID) | INTRAMUSCULAR | Status: DC | PRN

## 2016-06-24 MED ORDER — POTASSIUM CHLORIDE CRYS ER 20 MEQ PO TBCR
40.0000 meq | EXTENDED_RELEASE_TABLET | Freq: Once | ORAL | Status: AC
  Administered 2016-06-24: 40 meq via ORAL
  Filled 2016-06-24: qty 2

## 2016-06-24 MED ORDER — LISINOPRIL 40 MG PO TABS
40.0000 mg | ORAL_TABLET | Freq: Every day | ORAL | Status: DC
  Administered 2016-06-24 – 2016-06-25 (×2): 40 mg via ORAL
  Filled 2016-06-24 (×2): qty 1

## 2016-06-24 NOTE — Op Note (Signed)
NAME:  [name] [name], [name] [name]                   ACCOUNT NO.:  [id number]  MEDICAL RECORD NO.:  [id number]  LOCATION:                                FACILITY:  [facility]  PHYSICIAN:  Rod Can, MD     DATE OF BIRTH:  November 19, 1965  DATE OF PROCEDURE:  06/23/2016 DATE OF DISCHARGE:                              OPERATIVE REPORT   SURGEON:  Rod Can, MD  ASSISTANT:  April Green, RNFA.  PREOPERATIVE DIAGNOSIS:  Right distal third tibia-fibula shaft fracture.  POSTOPERATIVE DIAGNOSIS:  Right distal third tibia-fibula shaft fracture.  PROCEDURE PERFORMED:  Intramedullary fixation of right tibia.  IMPLANTS: 1. Biomet VersaNail 10 x 360 mm nail. 2. 5.5 mm proximal interlocking screws x2. 3. 4.5 mm distal interlocking screws x3.  ANESTHESIA:  General.  ESTIMATED BLOOD LOSS:  Minimal.  COMPLICATIONS:  None.  TUBES AND DRAINS:  None.  DISPOSITION:  Stable to PACU.  INDICATIONS:  The patient is a 51 year old female with uncontrolled diabetes, who slipped and fell in her yard earlier today.  She had right lower leg pain and deformity, inability to weightbear.  She was brought to the emergency department.  X-rays revealed distal third tib-fib shaft fracture.  We discussed the risks, benefits, alternatives to intramedullary fixation, she elected to proceed.  DESCRIPTION OF PROCEDURE IN DETAIL:  I identified the patient in the holding area using two identifiers.  Surgical site was marked by myself. She was taken to the operating room, placed supine on the operating room table.  Bump was placed under the hip, all bony prominences were well padded.  She was placed on a bone foam semi-extended position.  She did receive IV antibiotics within 60 minutes at beginning of the procedure. She received 3 g of Ancef.  I began by using fluoroscopy to define her anatomy.  I made a 4-inch incision over the lateral border of the patella, a limited lateral peripatellar arthrotomy was made.  I used digital  dissection to develop the plane between the fat pad and the patellar tendon.  I used the awl to determine the standard starting point for a tibial nail.  The guidepin was placed, entry reamer was used.  I reduced the fracture, I held it in place with a clamp through two small stab incisions.  Anatomic reduction was achieved.  Guidewire was passed down to the physeal scar of the ankle.  I reamed sequentially up to an 11-mm reamer with excellent chatter.  The real 10-mm nail was selected, impacted into place.  The nail was locked proximally with a medial-to-lateral static screw and a proximal oblique screw going from anterolateral to posterior medial in order to avoid the peroneal nerve. I then turned my attention distally.  Using perfect circle technique, I placed two medial-to-lateral screws, I also placed one anterior-to- posterior screw.  Again, the tibia fracture was anatomically reduced. The fibula fracture was within acceptable alignment.  The wounds were copiously irrigated with saline.  The arthrotomy was closed with #1 Vicryl.  Deep dermal layer was closed with 2-0 interrupted Monocryl, staples for the skin.  Sterile bulky dressings were applied.  The patient was extubated, taken to the  PACU in stable condition.  Sponge, needle and instrument counts were correct at the end of the case x2. There were no known complications.  Postoperatively, we will admit the patient to the Hospitalist.  She will need strict glucose control.  She will be touchdown weightbearing right lower extremity.  We will place her on Lovenox for DVT prophylaxis.  She will work with physical therapy and have disposition planning.  I will see her in the office 2 weeks after discharge.  She will have compartment checks for 24 hours.          ______________________________ Rod Can, MD     BS/MEDQ  D:  06/23/2016  T:  06/23/2016  Job:  [id number]

## 2016-06-24 NOTE — Progress Notes (Signed)
Progress Note    [name] [name]  UXL:[id number] DOB: 08-04-1965  DOA: 06/23/2016 PCP: No PCP Per Patient    Brief Narrative:   Chief complaint: Follow-up right tibial-fibula fracture  Medical records reviewed and are as summarized below:  [name] [name] is an 51 y.o. female with a PMH of hypertension, hyperlipidemia, diabetes, diabetic retinopathy, chronic back pain, and idiopathic remote DVT who was admitted 06/23/16 after a fall resulting in a right tibial-fibula fracture  Assessment/Plan:   Principal Problem:   Displaced comminuted fracture of shaft of right tibia Status post intramedullary fixation of right tibia 06/23/16. Will need evaluation for osteoporosis post discharge with DEXA scan. Continue pain management, PT/OT.  Active Problems:   Diabetes mellitus type 2 in obese (HCC) Hemoglobin A1c pending. Currently being managed with moderate scale SSI. CBGs 97-123.    Type 2 diabetes mellitus with hyperlipidemia (HCC) Resume Lipitor.    Essential hypertension Continue lisinopril.    Hypokalemia Replete.    HIV screening The patient falls between the ages of 13-64 and should be screened for HIV, therefore HIV testing ordered.  Family Communication/Anticipated D/C date and plan/Code Status   DVT prophylaxis: Lovenox ordered. Code Status: Full Code.  Family Communication: Daughter at bedside. Disposition Plan: Home in 24-48 hours.   Medical Consultants:    Orthopedic Surgery   Procedures:    Intramedullary fixation of right tibia 06/23/16  Anti-Infectives:    None  Subjective:   Reports some sharp pain and muscle spasms in the right leg, relieved with Dilaudid.  Mild nausea.  Last BM was yesterday. No dyspnea or chest pain.  Objective:    Vitals:   06/23/16 2045 06/23/16 2100 06/23/16 2111 06/24/16 0435  BP: (!) 141/88 116/68 130/75 (!) 115/59  Pulse: 80 82 83 76  Resp: 19 (!) 26 10 16   Temp:  98.1 F (36.7 C)  97.9 F (36.6 C)  TempSrc:    Oral    SpO2: 93% 100% 92% 94%    Intake/Output Summary (Last 24 hours) at 06/24/16 0802 Last data filed at 06/23/16 2012  Gross per 24 hour  Intake             1300 ml  Output               25 ml  Net             1275 ml   There were no vitals filed for this visit.  Exam: General exam: Appears calm and comfortable.  Respiratory system: Clear to auscultation. Respiratory effort normal. Cardiovascular system: S1 & S2 heard, RRR. No JVD,  rubs, gallops or clicks. No murmurs. Gastrointestinal system: Abdomen is nondistended, soft and nontender. No organomegaly or masses felt. Normal bowel sounds heard. Central nervous system: Alert and oriented. No focal neurological deficits. Extremities: No clubbing,  or cyanosis. No edema. Right leg in ACE wrap from thigh to foot. Skin: No rashes, lesions or ulcers. Psychiatry: Judgement and insight appear normal. Mood & affect appropriate.   Data Reviewed:   I have personally reviewed following labs and imaging studies:  Labs: Basic Metabolic Panel:  Recent Labs Lab 06/23/16 1648  NA 138  K 3.4*  CL 103  CO2 26  GLUCOSE 112*  BUN 16  CREATININE 0.70  CALCIUM 8.8*   GFR CrCl cannot be calculated (Unknown ideal weight.). Liver Function Tests: No results for input(s): AST, ALT, ALKPHOS, BILITOT, PROT, ALBUMIN in the last 168 hours. No results for input(s): LIPASE, AMYLASE  in the last 168 hours. No results for input(s): AMMONIA in the last 168 hours. Coagulation profile No results for input(s): INR, PROTIME in the last 168 hours.  CBC:  Recent Labs Lab 06/23/16 1648  WBC 12.7*  HGB 12.9  HCT 38.8  MCV 78.2  PLT 254   Cardiac Enzymes: No results for input(s): CKTOTAL, CKMB, CKMBINDEX, TROPONINI in the last 168 hours. BNP (last 3 results) No results for input(s): PROBNP in the last 8760 hours. CBG:  Recent Labs Lab 06/23/16 1728 06/23/16 2000  GLUCAP 97 123*   Hgb A1c: No results for input(s): HGBA1C in the last 72  hours.  Microbiology Recent Results (from the past 240 hour(s))  Surgical PCR screen     Status: None   Collection Time: 06/23/16  8:01 PM  Result Value Ref Range Status   MRSA, PCR NEGATIVE NEGATIVE Final   Staphylococcus aureus NEGATIVE NEGATIVE Final    Comment:        The Xpert SA Assay (FDA approved for NASAL specimens in patients over 51 years of age), is one component of a comprehensive surveillance program.  Test performance has been validated by [facility] for patients greater than or equal to 48 year old. It is not intended to diagnose infection nor to guide or monitor treatment.     Radiology: Dg Tibia/fibula Right  Result Date: 06/23/2016 CLINICAL DATA:  ORIF of right tibial fracture EXAM: RIGHT TIBIA AND FIBULA - 2 VIEW; DG C-ARM 61-120 MIN COMPARISON:  Films from earlier in the same day FLUOROSCOPY TIME:  Fluoroscopy Time:  3 minutes 25 seconds Radiation Exposure Index (if provided by the fluoroscopic device): Not available Number of Acquired Spot Images: 7 FINDINGS: Medullary rod is noted in the right tibia with both proximal and distal fixation screws. Fracture fragments are in anatomic alignment. The fibular fracture is in near anatomic alignment with only mild displacement at the fracture site. IMPRESSION: ORIF of right tibial fracture. Electronically Signed   By: Inez Catalina M.D.   On: 06/23/2016 19:56   Dg Tibia/fibula Right  Result Date: 06/23/2016 CLINICAL DATA:  Right leg deformity status post fall. EXAM: RIGHT TIBIA AND FIBULA - 2 VIEW COMPARISON:  None. FINDINGS: Oblique fracture of the mid -distal right tibial diaphysis with 10 mm of lateral displacement and 6 mm of anterior displacement. Mild apex anterior angulation. Oblique displaced fracture of the distal fibular diaphysis with a 10 mm of lateral displacement and 13 mm of posterior displacement. No other fracture or dislocation.  Ankle mortise is intact. Soft tissue swelling around the fracture site.  IMPRESSION: 1. Oblique fracture of the mid -distal right tibial diaphysis with 10 mm of lateral displacement and 6 mm of anterior displacement. 2. Oblique displaced fracture of the distal fibular diaphysis with a 10 mm of lateral displacement and 13 mm of posterior displacement. Electronically Signed   By: Kathreen Devoid   On: 06/23/2016 15:19   Dg Tibia/fibula Right Port  Result Date: 06/23/2016 CLINICAL DATA:  Status post ORIF of right tibial fracture EXAM: PORTABLE RIGHT TIBIA AND FIBULA - 2 VIEW COMPARISON:  06/23/2016 FINDINGS: Medullary rod is noted within the tibia with distal and proximal fixation screws. The fracture fragments are in anatomic alignment. Near anatomic alignment of the fibular fracture is seen. IMPRESSION: Status post ORIF of right tibial fracture. Electronically Signed   By: Inez Catalina M.D.   On: 06/23/2016 20:37   Dg C-arm 1-60 Min  Result Date: 06/23/2016 CLINICAL DATA:  ORIF  of right tibial fracture EXAM: RIGHT TIBIA AND FIBULA - 2 VIEW; DG C-ARM 61-120 MIN COMPARISON:  Films from earlier in the same day FLUOROSCOPY TIME:  Fluoroscopy Time:  3 minutes 25 seconds Radiation Exposure Index (if provided by the fluoroscopic device): Not available Number of Acquired Spot Images: 7 FINDINGS: Medullary rod is noted in the right tibia with both proximal and distal fixation screws. Fracture fragments are in anatomic alignment. The fibular fracture is in near anatomic alignment with only mild displacement at the fracture site. IMPRESSION: ORIF of right tibial fracture. Electronically Signed   By: Inez Catalina M.D.   On: 06/23/2016 19:56    Medications:   . HYDROmorphone      . HYDROmorphone      . HYDROmorphone      . ketorolac  15 mg Intravenous Q6H  . ketorolac      . oxyCODONE       Continuous Infusions:  Medical decision making is of high complexity and this patient is at high risk of deterioration, therefore this is a level 3 visit.  (> 4 problem points, 2 data points, high  risk: Need 2 out of 3)   LOS: 1 day   Decari Duggar  [facility] Pager [phone]. If unable to reach me by pager, please call my cell phone at [phone].  *Please refer to [email or web address], password [email or web address] to get updated schedule on who will round on this patient, as hospitalists switch teams weekly. If 7PM-7AM, please contact night-coverage at [email or web address], password [email or web address] for any overnight needs.  06/24/2016, 8:02 AM

## 2016-06-24 NOTE — Evaluation (Signed)
Physical Therapy Evaluation Patient Details Name: [name] [name] MRN: [id number] DOB: Jul 01, 1965 Today's Date: 06/24/2016   History of Present Illness  Pt admitted with R tib-fib fx s/p fall. Pt with ORIF intramedullary nail fixation 06/23/16. PMH uncontrolled DM, hx of DVT, HTN, and LBP.   Clinical Impression  Patient is s/p above surgery resulting in functional limitations due to the deficits listed below (see PT Problem List). Pt minA for bed mobility, transfers with RW and hop-to ambulation of 8 ft with RW. Patient will benefit from skilled PT to increase their independence and safety with mobility to allow discharge to the venue listed below.       Follow Up Recommendations Home health PT;Supervision/Assistance - 24 hour    Equipment Recommendations   (pt has equipment from back surgery)    Recommendations for Other Services OT consult     Precautions / Restrictions Precautions Precautions: Fall Precaution Comments: fall resulting in fx Restrictions Weight Bearing Restrictions: Yes RLE Weight Bearing: Touchdown weight bearing      Mobility  Bed Mobility Overal bed mobility: Needs Assistance Bed Mobility: Supine to Sit     Supine to sit: Min assist;HOB elevated     General bed mobility comments: pad scoot to EoB, vc for Osment placement  Transfers Overall transfer level: Needs assistance Equipment used: Rolling walker (2 wheeled) Transfers: Sit to/from Stand Sit to Stand: Min assist         General transfer comment: pt with good power up assist for steadying at upright. vc for Gettel placement for pushoff and armrests for descent  Ambulation/Gait Ambulation/Gait assistance: Min assist Ambulation Distance (Feet): 8 Feet Assistive device: Rolling walker (2 wheeled) Gait Pattern/deviations: Antalgic (hop to pattern to maintain TTWB ) Gait velocity: slowed Gait velocity interpretation: Below normal speed for age/gender General Gait Details: vc for maintaining TTWB pt  steady with hopping, tires easily      Balance Overall balance assessment: Needs assistance Sitting-balance support: Single extremity supported Sitting balance-Leahy Scale: Good Sitting balance - Comments: able to sit EoB with single extremity support   Standing balance support: Bilateral upper extremity supported Standing balance-Leahy Scale: Poor Standing balance comment: requires RW for balance                             Pertinent Vitals/Pain Pain Assessment: 0-10 Pain Score: 8  Pain Location: r knee and ankle Pain Descriptors / Indicators: Constant;Burning;Throbbing;Pins and needles Pain Intervention(s): Limited activity within patient's tolerance;Monitored during session;RN gave pain meds during session;Ice applied  VSS    Home Living Family/patient expects to be discharged to:: Private residence Living Arrangements: Spouse/significant other Available Help at Discharge: Family;Available 24 hours/day Type of Home: House Home Access: Ramped entrance     Home Layout: One level Home Equipment: Walker - 2 wheels;Bedside commode;Shower seat      Prior Function Level of Independence: Independent                  Extremity/Trunk Assessment   Upper Extremity Assessment Upper Extremity Assessment: Overall WFL for tasks assessed    Lower Extremity Assessment Lower Extremity Assessment: RLE deficits/detail;LLE deficits/detail RLE Deficits / Details: R hip and knee ROM and strength limited by pain RLE: Unable to fully assess due to pain RLE Sensation: history of peripheral neuropathy;decreased light touch (below mid calf deep pressure required for feeling) LLE Deficits / Details: ROM WFL and strength grossly 4/5 LLE Sensation: history of peripheral neuropathy;decreased light touch (  decreased sensation below mid calf)    Cervical / Trunk Assessment Cervical / Trunk Assessment: Normal  Communication   Communication: No difficulties  Cognition  Arousal/Alertness: Awake/alert Behavior During Therapy: WFL for tasks assessed/performed Overall Cognitive Status: Within Functional Limits for tasks assessed                                               Assessment/Plan    PT Assessment Patient needs continued PT services  PT Problem List Decreased strength;Decreased range of motion;Decreased activity tolerance;Decreased balance;Decreased mobility;Decreased knowledge of use of DME;Impaired sensation;Pain       PT Treatment Interventions DME instruction;Gait training;Functional mobility training;Therapeutic activities;Therapeutic exercise;Stair training    PT Goals (Current goals can be found in the Care Plan section)  Acute Rehab PT Goals Patient Stated Goal: to go home PT Goal Formulation: With patient Potential to Achieve Goals: Good    Frequency Min 5X/week    AM-PAC PT "6 Clicks" Daily Activity  Outcome Measure Difficulty turning over in bed (including adjusting bedclothes, sheets and blankets)?: Total Difficulty moving from lying on back to sitting on the side of the bed? : Total Difficulty sitting down on and standing up from a chair with arms (e.g., wheelchair, bedside commode, etc,.)?: Total Help needed moving to and from a bed to chair (including a wheelchair)?: A Little Help needed walking in hospital room?: A Lot Help needed climbing 3-5 steps with a railing? : Total 6 Click Score: 9    End of Session Equipment Utilized During Treatment: Gait belt Activity Tolerance: Patient limited by pain Patient left: in chair;with call bell/phone within reach;with nursing/sitter in room Nurse Communication: Mobility status;Weight bearing status (tingling in R foot with dependent position) PT Visit Diagnosis: Other abnormalities of gait and mobility (R26.89);History of falling (Z91.81);Pain;Other symptoms and signs involving the nervous system (R29.898) Pain - Right/Left: Right Pain - part of body: Leg     Time: 5093-2671 PT Time Calculation (min) (ACUTE ONLY): 37 min   Charges:   PT Evaluation $PT Eval Low Complexity: 1 Procedure PT Treatments $Therapeutic Activity: 8-22 mins   PT G Codes:        Harding Thomure B. Migdalia Dk PT, DPT Acute Rehabilitation  [phone] Pager [phone]    [place] 06/24/2016, 5:43 PM

## 2016-06-24 NOTE — Progress Notes (Signed)
   Subjective:  Patient reports pain as moderate.  Denies N/V/CP/SOB/paresthesias.  Objective:   VITALS:   Vitals:   06/23/16 2045 06/23/16 2100 06/23/16 2111 06/24/16 0435  BP: (!) 141/88 116/68 130/75 (!) 115/59  Pulse: 80 82 83 76  Resp: 19 (!) 26 10 16   Temp:  98.1 F (36.7 C)  97.9 F (36.6 C)  TempSrc:    Oral  SpO2: 93% 100% 92% 94%    NAD ABD soft Sensation intact distally Intact pulses distally Dorsiflexion/Plantar flexion intact Incision: scant drainage Compartment soft No pain with passive stretch   Lab Results  Component Value Date   WBC 12.7 (H) 06/23/2016   HGB 12.9 06/23/2016   HCT 38.8 06/23/2016   MCV 78.2 06/23/2016   PLT 254 06/23/2016   BMET    Component Value Date/Time   NA 138 06/23/2016 1648   K 3.4 (L) 06/23/2016 1648   CL 103 06/23/2016 1648   CO2 26 06/23/2016 1648   GLUCOSE 112 (H) 06/23/2016 1648   BUN 16 06/23/2016 1648   CREATININE 0.70 06/23/2016 1648   CREATININE 0.59 05/09/2013 1631   CALCIUM 8.8 (L) 06/23/2016 1648   GFRNONAA >60 06/23/2016 1648   GFRAA >60 06/23/2016 1648     Assessment/Plan: 1 Day Post-Op   Principal Problem:   Displaced comminuted fracture of shaft of right tibia, initial encounter for closed fracture Active Problems:   Diabetes mellitus type 2 in obese ([place])   Type 2 diabetes mellitus with hyperlipidemia (HCC)   Essential hypertension  No evidence of impending compartment syndrome  TDWB RLE with walker DVT ppx: lovenox x 30 days, SCDs, TEDs PO pain control PT/OT Glucose control Dispo: D/C planning, can likely d/c home with HHPT   [name] [name], [name] [name] 06/24/2016, 7:49 AM   Rod Can, MD Cell [phone]

## 2016-06-25 DIAGNOSIS — E876 Hypokalemia: Secondary | ICD-10-CM

## 2016-06-25 LAB — BASIC METABOLIC PANEL
Anion gap: 11 (ref 5–15)
BUN: 19 mg/dL (ref 6–20)
CO2: 24 mmol/L (ref 22–32)
Calcium: 8.4 mg/dL — ABNORMAL LOW (ref 8.9–10.3)
Chloride: 101 mmol/L (ref 101–111)
Creatinine, Ser: 0.83 mg/dL (ref 0.44–1.00)
GFR calc Af Amer: >60 mL/min (ref >60)
GFR calc non Af Amer: >60 mL/min (ref >60)
Glucose, Bld: 118 mg/dL — ABNORMAL HIGH (ref 65–99)
Potassium: 3.9 mmol/L (ref 3.5–5.1)
Sodium: 136 mmol/L (ref 135–145)

## 2016-06-25 LAB — CBC
HCT: 35 % — ABNORMAL LOW (ref 36.0–46.0)
Hemoglobin: 11.1 g/dL — ABNORMAL LOW (ref 12.0–15.0)
MCH: 25.7 pg — ABNORMAL LOW (ref 26.0–34.0)
MCHC: 31.7 g/dL (ref 30.0–36.0)
MCV: 81 fL (ref 78.0–100.0)
Platelets: 217 10*3/uL (ref 150–400)
RBC: 4.32 MIL/uL (ref 3.87–5.11)
RDW: 13.7 % (ref 11.5–15.5)
WBC: 7.9 10*3/uL (ref 4.0–10.5)

## 2016-06-25 LAB — HIV ANTIBODY (ROUTINE TESTING W REFLEX): HIV Screen 4th Generation wRfx: NONREACTIVE

## 2016-06-25 LAB — HEMOGLOBIN A1C
Hgb A1c MFr Bld: 13.7 % — ABNORMAL HIGH (ref 4.8–5.6)
Mean Plasma Glucose: 346 mg/dL

## 2016-06-25 LAB — GLUCOSE, CAPILLARY: Glucose-Capillary: 119 mg/dL — ABNORMAL HIGH (ref 65–99)

## 2016-06-25 MED ORDER — INSULIN GLARGINE 100 UNIT/ML ~~LOC~~ SOLN
10.0000 [IU] | SUBCUTANEOUS | Status: DC
  Administered 2016-06-25: 10 [IU] via SUBCUTANEOUS
  Filled 2016-06-25 (×2): qty 0.1

## 2016-06-25 MED ORDER — ENOXAPARIN SODIUM 40 MG/0.4ML ~~LOC~~ SOLN: 40.0000 mg | Freq: Every day | SUBCUTANEOUS | 0 refills | Status: AC

## 2016-06-25 MED ORDER — HYDROCODONE-ACETAMINOPHEN 5-325 MG PO TABS: 1.0000 | ORAL_TABLET | ORAL | 0 refills | Status: AC | PRN

## 2016-06-25 MED ORDER — ONDANSETRON HCL 4 MG PO TABS: 4.0000 mg | ORAL_TABLET | Freq: Four times a day (QID) | ORAL | 0 refills | Status: AC | PRN

## 2016-06-25 MED ORDER — DOCUSATE SODIUM 100 MG PO CAPS: 100.0000 mg | ORAL_CAPSULE | Freq: Two times a day (BID) | ORAL | 0 refills | Status: AC

## 2016-06-25 NOTE — Progress Notes (Signed)
   Subjective:  Patient reports pain as moderate.  Denies N/V/CP/SOB/paresthesias.  Objective:   VITALS:   Vitals:   06/24/16 0435 06/24/16 1524 06/24/16 2100 06/25/16 0652  BP: (!) 115/59 111/65 132/63 132/65  Pulse: 76 76 76 96  Resp: 16 17 20 16   Temp: 97.9 F (36.6 C) 98.6 F (37 C) 98.9 F (37.2 C) 99.7 F (37.6 C)  TempSrc: Oral Oral Oral Oral  SpO2: 94% 95% 98% 93%    NAD ABD soft Sensation intact distally Intact pulses distally Dorsiflexion/Plantar flexion intact Incision: scant drainage Compartment soft No pain with passive stretch   Lab Results  Component Value Date   WBC 7.9 06/25/2016   HGB 11.1 (L) 06/25/2016   HCT 35.0 (L) 06/25/2016   MCV 81.0 06/25/2016   PLT 217 06/25/2016   BMET    Component Value Date/Time   NA 136 06/25/2016 0529   K 3.9 06/25/2016 0529   CL 101 06/25/2016 0529   CO2 24 06/25/2016 0529   GLUCOSE 118 (H) 06/25/2016 0529   BUN 19 06/25/2016 0529   CREATININE 0.83 06/25/2016 0529   CREATININE 0.59 05/09/2013 1631   CALCIUM 8.4 (L) 06/25/2016 0529   GFRNONAA >60 06/25/2016 0529   GFRAA >60 06/25/2016 0529     Assessment/Plan: 2 Days Post-Op   Principal Problem:   Displaced comminuted fracture of shaft of right tibia, initial encounter for closed fracture Active Problems:   Diabetes mellitus type 2 in obese ([place])   Type 2 diabetes mellitus with hyperlipidemia (HCC)   Essential hypertension   Hypokalemia  No evidence of impending compartment syndrome  TDWB RLE with walker DVT ppx: lovenox x 30 days, SCDs, TEDs PO pain control PT/OT Glucose control Dispo: D/C home with HHPT   Armanda Forand, Horald Pollen 06/25/2016, 10:34 AM   Rod Can, MD Cell [phone]

## 2016-06-25 NOTE — Care Management Note (Signed)
Case Management Note  Patient Details  Name: [name] MRN: [id number] Date of Birth: 11/30/1965  Subjective/Objective:     51 yr old female s/p IM Nailing of right Tibia fracture.               Action/Plan:  Case manager spoke with patient concerning discharge plan and DME needs. Patient states she has RW and    Expected Discharge Date:  06/25/16               Expected Discharge Plan:  [place]  In-House Referral:     Discharge planning Services  CM Consult  Post Acute Care Choice:  Home Health Choice offered to:  Patient  DME Arranged:  N/A (Has RW and 3in1) DME Agency:  NA  HH Arranged:  PT [place] Agency:  [place] [facility]  Status of Service:  Completed, signed off  If discussed at [place] of Stay Meetings, dates discussed:    Additional Comments:  Ninfa Meeker, RN 06/25/2016, 11:40 AM

## 2016-06-25 NOTE — Progress Notes (Signed)
Patient having n/v. ivf was put back on continuously at 78ml/hr for hydration. Patient has not been able to keep anything down. Prn pain med given twice on shift with effective results. Dry drainage noted to rle.

## 2016-06-25 NOTE — Progress Notes (Signed)
Reviewed discharge papers and medications with full understanding 

## 2016-06-25 NOTE — Progress Notes (Signed)
Physical Therapy Treatment Patient Details Name: [name] [name] MRN: [id number] DOB: 07/30/1965 Today's Date: 06/25/2016    History of Present Illness Pt admitted with R tib-fib fx s/p fall. Pt with ORIF intramedullary nail fixation 06/23/16. PMH uncontrolled DM, hx of DVT, HTN, and LBP.     PT Comments    Pt is making good progress towards goals. Pt supervision for bed mobility, and min A for stabilizing for transfers with minA and ambulation of 16 feet with RW. Pt requires skilled PT for progression of ambulation and improving LE ROM, strength and endurance to safely navigate her home environment.   Follow Up Recommendations  Home health PT;Supervision/Assistance - 24 hour     Equipment Recommendations   (pt has equipment from back surgery)    Recommendations for Other Services OT consult     Precautions / Restrictions Precautions Precautions: Fall Precaution Comments: fall resulting in fx Restrictions Weight Bearing Restrictions: Yes RLE Weight Bearing: Touchdown weight bearing    Mobility  Bed Mobility Overal bed mobility: Needs Assistance Bed Mobility: Supine to Sit     Supine to sit: HOB elevated;Supervision     General bed mobility comments: vc for Raborn placement to scoot EoB  Transfers Overall transfer level: Needs assistance Equipment used: Rolling walker (2 wheeled) Transfers: Sit to/from Stand Sit to Stand: Min assist         General transfer comment: pt with good power up minA to come up to RW   Ambulation/Gait Ambulation/Gait assistance: Min assist Ambulation Distance (Feet): 16 Feet Assistive device: Rolling walker (2 wheeled) Gait Pattern/deviations: Antalgic;Trunk flexed (hop to pattern to maintain TTWB ) Gait velocity: slowed Gait velocity interpretation: Below normal speed for age/gender General Gait Details: vc for maintaining TTWB pt steady with hopping, tires easily         Balance Overall balance assessment: Needs  assistance Sitting-balance support: Single extremity supported Sitting balance-Leahy Scale: Good Sitting balance - Comments: able to sit EoB with single extremity support   Standing balance support: Bilateral upper extremity supported Standing balance-Leahy Scale: Fair Standing balance comment: able to wash hands with single UE support, minor LoB with no upper extremity support vc to maintain at least one Lanz on the walker                            Cognition Arousal/Alertness: Awake/alert Behavior During Therapy: WFL for tasks assessed/performed Overall Cognitive Status: Within Functional Limits for tasks assessed                                        Exercises General Exercises - Lower Extremity Quad Sets: AROM;Right;10 reps;Seated Long Arc Quad: AAROM;Right;10 reps;Seated Hip ABduction/ADduction: AAROM;Right;10 reps;Seated Hip Flexion/Marching: AAROM;Right;10 reps;Seated    General Comments General comments (skin integrity, edema, etc.): Pt with minor difficulty with safety awarness as she has never used RW before. Pt requires at least single UE support on RW to maintain balance. Pt also educated on need to stay within walker when mobilizing      Pertinent Vitals/Pain Pain Assessment: 0-10 Pain Score: 7  Pain Location: r knee and ankle Pain Descriptors / Indicators: Constant;Burning;Throbbing;Pins and needles Pain Intervention(s): Limited activity within patient's tolerance;Monitored during session;Premedicated before session  VSS           PT Goals (current goals can now be found in the care plan section)  Acute Rehab PT Goals Patient Stated Goal: to go home PT Goal Formulation: With patient Time For Goal Achievement: 07/08/16 Potential to Achieve Goals: Good Progress towards PT goals: Progressing toward goals    Frequency    Min 5X/week      PT Plan Current plan remains appropriate       AM-PAC PT "6 Clicks" Daily Activity   Outcome Measure  Difficulty turning over in bed (including adjusting bedclothes, sheets and blankets)?: Total Difficulty moving from lying on back to sitting on the side of the bed? : Total Difficulty sitting down on and standing up from a chair with arms (e.g., wheelchair, bedside commode, etc,.)?: Total Help needed moving to and from a bed to chair (including a wheelchair)?: A Little Help needed walking in hospital room?: A Lot Help needed climbing 3-5 steps with a railing? : Total 6 Click Score: 9    End of Session Equipment Utilized During Treatment: Gait belt Activity Tolerance: Patient limited by pain Patient left: in chair;with call bell/phone within reach Nurse Communication: Mobility status PT Visit Diagnosis: Other abnormalities of gait and mobility (R26.89);History of falling (Z91.81);Pain;Other symptoms and signs involving the nervous system (R29.898) Pain - Right/Left: Right Pain - part of body: Leg     Time: 0626-9485 PT Time Calculation (min) (ACUTE ONLY): 27 min  Charges:  $Gait Training: 8-22 mins $Therapeutic Exercise: 8-22 mins                    G Codes:       [name] [name] B. Migdalia Dk PT, DPT Acute Rehabilitation  [phone] Pager [phone]     [place] 06/25/2016, 12:44 PM

## 2016-06-25 NOTE — Discharge Summary (Signed)
Physician Discharge Summary  [name] [name] HAL:[id number] DOB: 01/06/1966 DOA: 06/23/2016  PCP: Haywood Pao, MD  Admit date: 06/23/2016 Discharge date: 06/25/2016  Admitted From: Home Discharge disposition: Home   Recommendations for Outpatient Follow-Up:   1. Please arrange for outpatient DEXA scan and vitamin D studies. Needs an osteoporosis workup and initiation of treatment if indicated. 2. Physical therapy arranged. 3. Needs hospital follow-up one week with CBC/chemistries drawn.   Discharge Diagnosis:   Principal Problem:   Displaced comminuted fracture of shaft of right tibia Active Problems:   Diabetes mellitus type 2 in obese (HCC)   Type 2 diabetes mellitus with hyperlipidemia ([place])   Essential hypertension   Hypokalemia  Discharge Condition: Improved.  Diet recommendation: Low sodium, heart healthy.  Carbohydrate-modified.    History of Present Illness:   [name] [name] is an 51 y.o. female with a PMH of hypertension, hyperlipidemia, diabetes, diabetic retinopathy, chronic back pain, and idiopathic remote DVT who was admitted 06/23/16 after a fall resulting in a right tibial-fibula fracture.   Hospital Course by Problem:   Principal Problem:   Displaced comminuted fracture of shaft of right tibia Status post intramedullary fixation of right tibia 06/23/16. Will need evaluation for osteoporosis post discharge with DEXA scan. Continue pain management, PT/OT.  Active Problems:   Postoperative nausea and vomiting IV fluids resumed last night secondary to intractable nausea and vomiting.    Diabetes mellitus type 2 in obese (HCC) Hemoglobin A1c 13.7. Currently being managed with moderate scale SSI. CBGs 123-241. Will add Lantus 10 units daily.    Type 2 diabetes mellitus with hyperlipidemia (HCC) Continue Lipitor.    Essential hypertension Continue lisinopril.    Hypokalemia Repleted.    HIV screening The patient falls between the ages of 13-64 and should  be screened for HIV, therefore HIV testing ordered.    Medical Consultants:    Orthopedic Surgery   Discharge Exam:   Vitals:   06/24/16 2100 06/25/16 0652  BP: 132/63 132/65  Pulse: 76 96  Resp: 20 16  Temp: 98.9 F (37.2 C) 99.7 F (37.6 C)   Vitals:   06/24/16 0435 06/24/16 1524 06/24/16 2100 06/25/16 0652  BP: (!) 115/59 111/65 132/63 132/65  Pulse: 76 76 76 96  Resp: 16 17 20 16   Temp: 97.9 F (36.6 C) 98.6 F (37 C) 98.9 F (37.2 C) 99.7 F (37.6 C)  TempSrc: Oral Oral Oral Oral  SpO2: 94% 95% 98% 93%    General exam: Appears calm and comfortable.  Respiratory system: Clear to auscultation. Respiratory effort normal. Cardiovascular system: S1 & S2 heard, RRR. No JVD,  rubs, gallops or clicks. No murmurs. Gastrointestinal system: Abdomen is nondistended, soft and nontender. No organomegaly or masses felt. Normal bowel sounds heard. Central nervous system: Alert and oriented. No focal neurological deficits. Extremities: No clubbing,  or cyanosis. No edema. Right lower extremity in Ace wrap from thigh to foot. Skin: No rashes, lesions or ulcers. Psychiatry: Judgement and insight appear normal. Mood & affect appropriate.    The results of significant diagnostics from this hospitalization (including imaging, microbiology, ancillary and laboratory) are listed below for reference.     Procedures and Diagnostic Studies:   Dg Tibia/fibula Right  Result Date: 06/23/2016 CLINICAL DATA:  ORIF of right tibial fracture EXAM: RIGHT TIBIA AND FIBULA - 2 VIEW; DG C-ARM 61-120 MIN COMPARISON:  Films from earlier in the same day FLUOROSCOPY TIME:  Fluoroscopy Time:  3 minutes 25 seconds Radiation Exposure Index (if provided  by the fluoroscopic device): Not available Number of Acquired Spot Images: 7 FINDINGS: Medullary rod is noted in the right tibia with both proximal and distal fixation screws. Fracture fragments are in anatomic alignment. The fibular fracture is in near  anatomic alignment with only mild displacement at the fracture site. IMPRESSION: ORIF of right tibial fracture. Electronically Signed   By: Inez Catalina M.D.   On: 06/23/2016 19:56   Dg Tibia/fibula Right  Result Date: 06/23/2016 CLINICAL DATA:  Right leg deformity status post fall. EXAM: RIGHT TIBIA AND FIBULA - 2 VIEW COMPARISON:  None. FINDINGS: Oblique fracture of the mid -distal right tibial diaphysis with 10 mm of lateral displacement and 6 mm of anterior displacement. Mild apex anterior angulation. Oblique displaced fracture of the distal fibular diaphysis with a 10 mm of lateral displacement and 13 mm of posterior displacement. No other fracture or dislocation.  Ankle mortise is intact. Soft tissue swelling around the fracture site. IMPRESSION: 1. Oblique fracture of the mid -distal right tibial diaphysis with 10 mm of lateral displacement and 6 mm of anterior displacement. 2. Oblique displaced fracture of the distal fibular diaphysis with a 10 mm of lateral displacement and 13 mm of posterior displacement. Electronically Signed   By: Kathreen Devoid   On: 06/23/2016 15:19   Dg Tibia/fibula Right Port  Result Date: 06/23/2016 CLINICAL DATA:  Status post ORIF of right tibial fracture EXAM: PORTABLE RIGHT TIBIA AND FIBULA - 2 VIEW COMPARISON:  06/23/2016 FINDINGS: Medullary rod is noted within the tibia with distal and proximal fixation screws. The fracture fragments are in anatomic alignment. Near anatomic alignment of the fibular fracture is seen. IMPRESSION: Status post ORIF of right tibial fracture. Electronically Signed   By: Inez Catalina M.D.   On: 06/23/2016 20:37   Dg C-arm 1-60 Min  Result Date: 06/23/2016 CLINICAL DATA:  ORIF of right tibial fracture EXAM: RIGHT TIBIA AND FIBULA - 2 VIEW; DG C-ARM 61-120 MIN COMPARISON:  Films from earlier in the same day FLUOROSCOPY TIME:  Fluoroscopy Time:  3 minutes 25 seconds Radiation Exposure Index (if provided by the fluoroscopic device): Not available  Number of Acquired Spot Images: 7 FINDINGS: Medullary rod is noted in the right tibia with both proximal and distal fixation screws. Fracture fragments are in anatomic alignment. The fibular fracture is in near anatomic alignment with only mild displacement at the fracture site. IMPRESSION: ORIF of right tibial fracture. Electronically Signed   By: Inez Catalina M.D.   On: 06/23/2016 19:56     Labs:   Basic Metabolic Panel:  Recent Labs Lab 06/23/16 1648 06/25/16 0529  NA 138 136  K 3.4* 3.9  CL 103 101  CO2 26 24  GLUCOSE 112* 118*  BUN 16 19  CREATININE 0.70 0.83  CALCIUM 8.8* 8.4*   GFR CrCl cannot be calculated (Unknown ideal weight.). Liver Function Tests: No results for input(s): AST, ALT, ALKPHOS, BILITOT, PROT, ALBUMIN in the last 168 hours. No results for input(s): LIPASE, AMYLASE in the last 168 hours. No results for input(s): AMMONIA in the last 168 hours. Coagulation profile No results for input(s): INR, PROTIME in the last 168 hours.  CBC:  Recent Labs Lab 06/23/16 1648 06/24/16 0852 06/25/16 0529  WBC 12.7* 6.7 7.9  HGB 12.9 11.3* 11.1*  HCT 38.8 34.5* 35.0*  MCV 78.2 80.2 81.0  PLT 254 209 217   Cardiac Enzymes: No results for input(s): CKTOTAL, CKMB, CKMBINDEX, TROPONINI in the last 168 hours. BNP: Invalid input(s):  POCBNP CBG:  Recent Labs Lab 06/23/16 2000 06/24/16 1254 06/24/16 1627 06/24/16 2105 06/25/16 0653  GLUCAP 123* 166* 215* 241* 119*   D-Dimer No results for input(s): DDIMER in the last 72 hours. Hgb A1c  Recent Labs  06/23/16 1655  HGBA1C 13.7*   Microbiology Recent Results (from the past 240 hour(s))  Surgical PCR screen     Status: None   Collection Time: 06/23/16  8:01 PM  Result Value Ref Range Status   MRSA, PCR NEGATIVE NEGATIVE Final   Staphylococcus aureus NEGATIVE NEGATIVE Final    Comment:        The Xpert SA Assay (FDA approved for NASAL specimens in patients over 34 years of age), is one component  of a comprehensive surveillance program.  Test performance has been validated by [facility] for patients greater than or equal to 14 year old. It is not intended to diagnose infection nor to guide or monitor treatment.      Discharge Instructions:   Discharge Instructions    Call MD for:  extreme fatigue    Complete by:  As directed    Call MD for:  redness, tenderness, or signs of infection (pain, swelling, redness, odor or green/yellow discharge around incision site)    Complete by:  As directed    Call MD for:  temperature >100.4    Complete by:  As directed    Diet Carb Modified    Complete by:  As directed    Increase activity slowly    Complete by:  As directed    Walker     Complete by:  As directed      Allergies as of 06/25/2016   No Known Allergies     Medication List    STOP taking these medications   insulin aspart 100 UNIT/ML FlexPen Commonly known as:  NOVOLOG FLEXPEN   ONETOUCH DELICA LANCETS FINE Misc     TAKE these medications   atorvastatin 20 MG tablet Commonly known as:  LIPITOR Take 20 mg by mouth daily.   docusate sodium 100 MG capsule Commonly known as:  COLACE Take 1 capsule (100 mg total) by mouth 2 (two) times daily.   enoxaparin 40 MG/0.4ML injection Commonly known as:  LOVENOX Inject 0.4 mLs (40 mg total) into the skin daily. Start taking on:  06/26/2016   glucose blood test strip Commonly known as:  ONETOUCH VERIO Use as instructed to check blood sugar 4 times per day dx code 250.02   HYDROcodone-acetaminophen 5-325 MG tablet Commonly known as:  NORCO/VICODIN Take 1-2 tablets by mouth every 4 (four) hours as needed (breakthrough pain).   insulin regular human CONCENTRATED 500 UNIT/ML injection Commonly known as:  HUMULIN R Inject 20 Units into the skin 2 (two) times daily. Breakfast and lunch   JARDIANCE 25 MG Tabs tablet Generic drug:  empagliflozin Take 25 mg by mouth daily.   lisinopril 40 MG tablet Commonly known  as:  PRINIVIL,ZESTRIL Take 1 tablet (40 mg total) by mouth daily.   MULTIVITAMIN PO Take 1 tablet by mouth daily.   ondansetron 4 MG tablet Commonly known as:  ZOFRAN Take 1 tablet (4 mg total) by mouth every 6 (six) hours as needed for nausea.   pravastatin 40 MG tablet Commonly known as:  PRAVACHOL Take 1 tablet (40 mg total) by mouth daily.            Durable Medical Equipment        Start  Ordered   06/24/16 0824  DME Walker rolling  Once    Question:  Patient needs a walker to treat with the following condition  Answer:  Displaced comminuted fracture of shaft of right tibia, initial encounter for closed fracture   06/24/16 0824   06/24/16 0824  DME 3 n 1  Once     06/24/16 5909     Follow-up Information    Swinteck, Horald Pollen, MD. Schedule an appointment as soon as possible for a visit in 2 week(s).   Specialty:  Orthopedic Surgery Why:  For wound re-check, For suture removal Contact information: [address] [place] [address] [phone]        [facility] Follow up.   Why:  A representative from [facility] will contact you to arrange start date and time for your therapy. Contact information: [address] [place] [address] [phone]            Time coordinating discharge: Greater than 35 minutes.  Signed:  Braelee [name]  Pager [phone] [facility] 06/25/2016, 11:39 AM

## 2016-07-01 NOTE — Anesthesia Postprocedure Evaluation (Signed)
Anesthesia Post Note  Patient: [name] [name]  Procedure(s) Performed: Procedure(s) (LRB): INTRAMEDULLARY (IM) NAIL TIBIAL (Right)  Patient location during evaluation: PACU Anesthesia Type: General Level of consciousness: awake Pain management: pain level controlled Vital Signs Assessment: post-procedure vital signs reviewed and stable Respiratory status: spontaneous breathing Cardiovascular status: stable Anesthetic complications: no       Last Vitals:  Vitals:   06/24/16 2100 06/25/16 0652  BP: 132/63 132/65  Pulse: 76 96  Resp: 20 16  Temp: 37.2 C 37.6 C    Last Pain:  Vitals:   06/25/16 0759  TempSrc:   PainSc: 6                  Foxx Klarich

## 2018-04-25 IMAGING — RF DG TIBIA/FIBULA 2V*R*
1 series · 7 of 7 positions shown · non-contrast
Comparison: Films from earlier in the same day

CLINICAL DATA: ORIF of right tibial fracture

EXAM:
RIGHT TIBIA AND FIBULA - 2 VIEW; DG C-ARM 61-120 MIN

[Series 1: run · 7 of 7 slices shown]
[im 1/7]
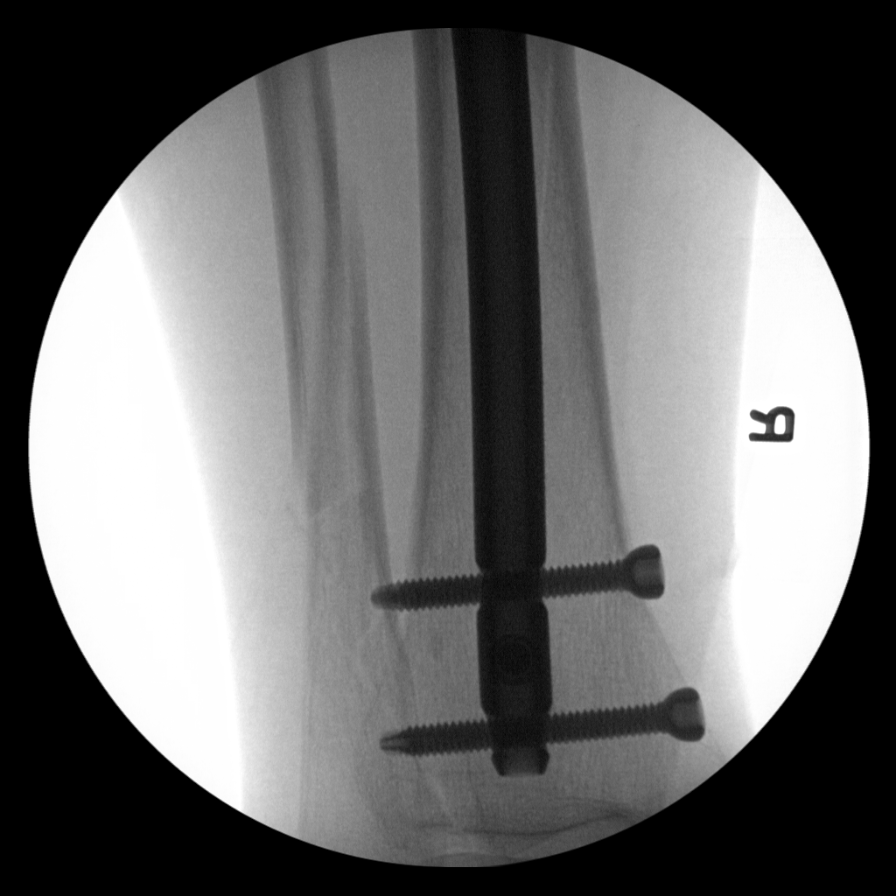
[im 2/7]
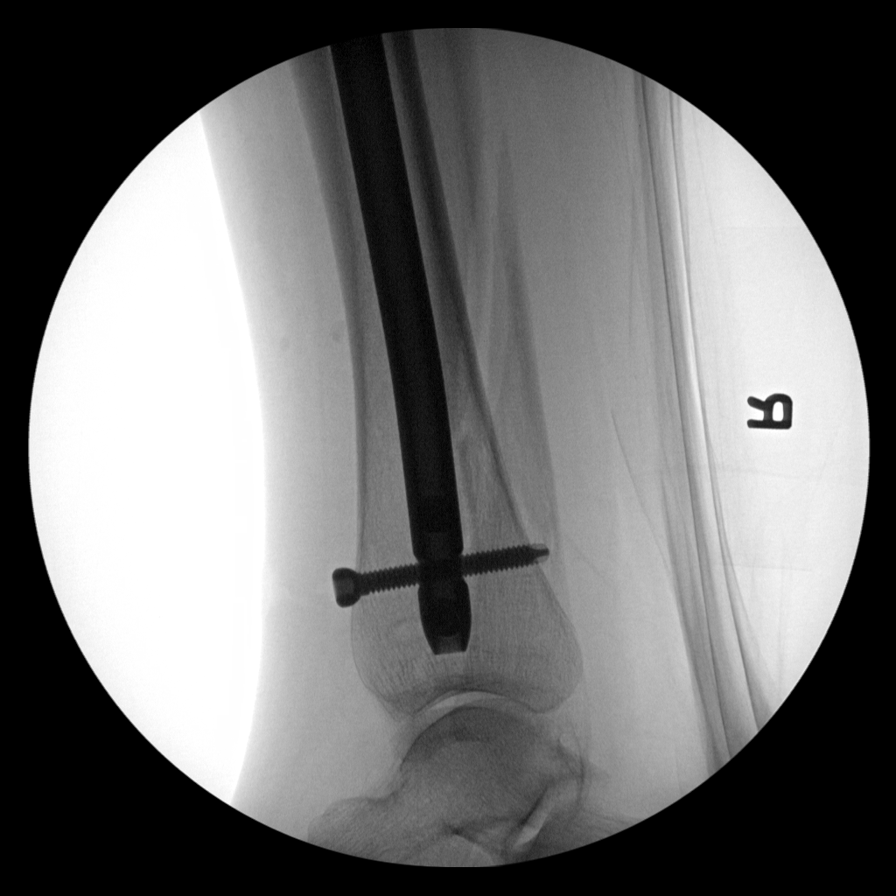
[im 3/7]
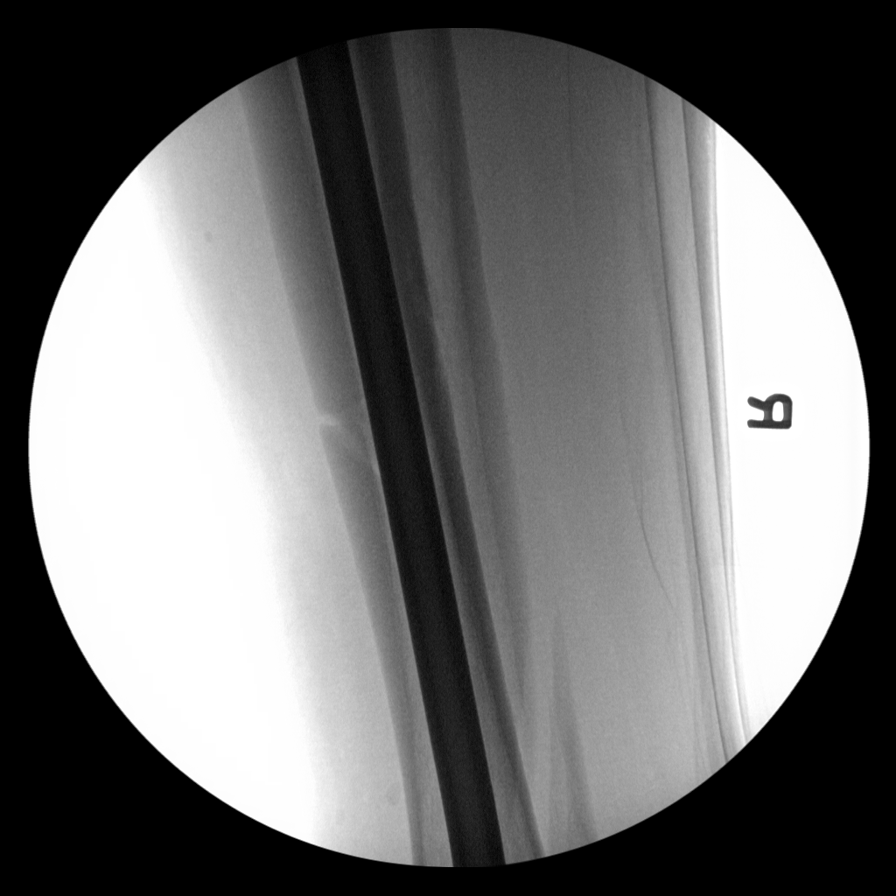
[im 4/7]
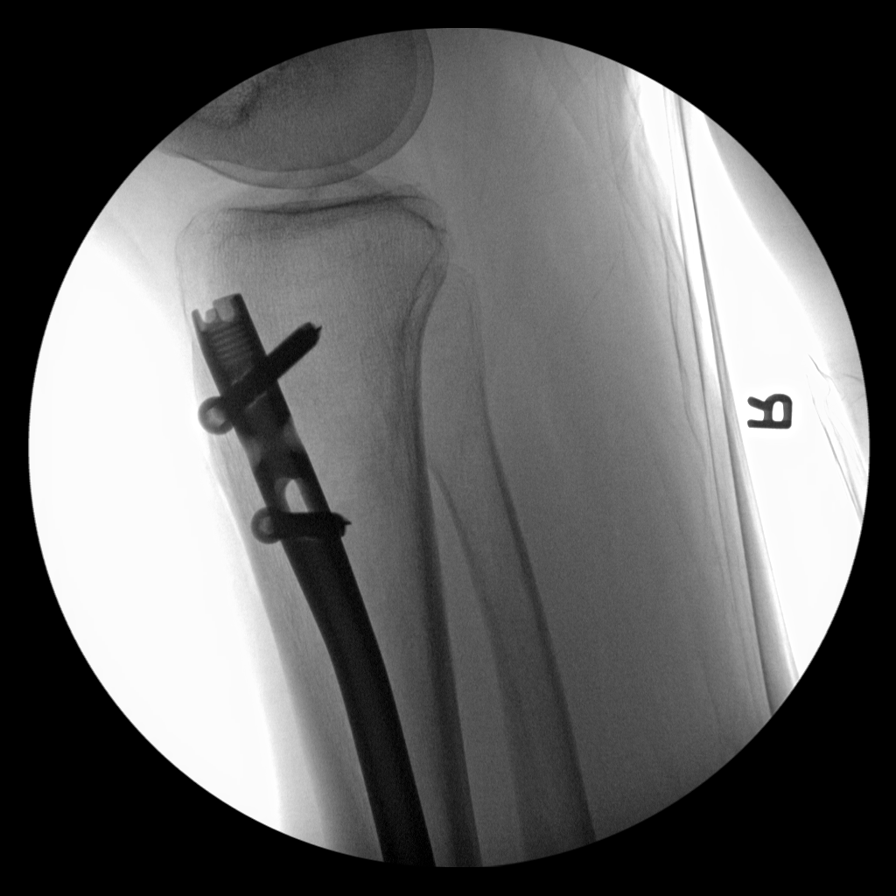
[im 5/7]
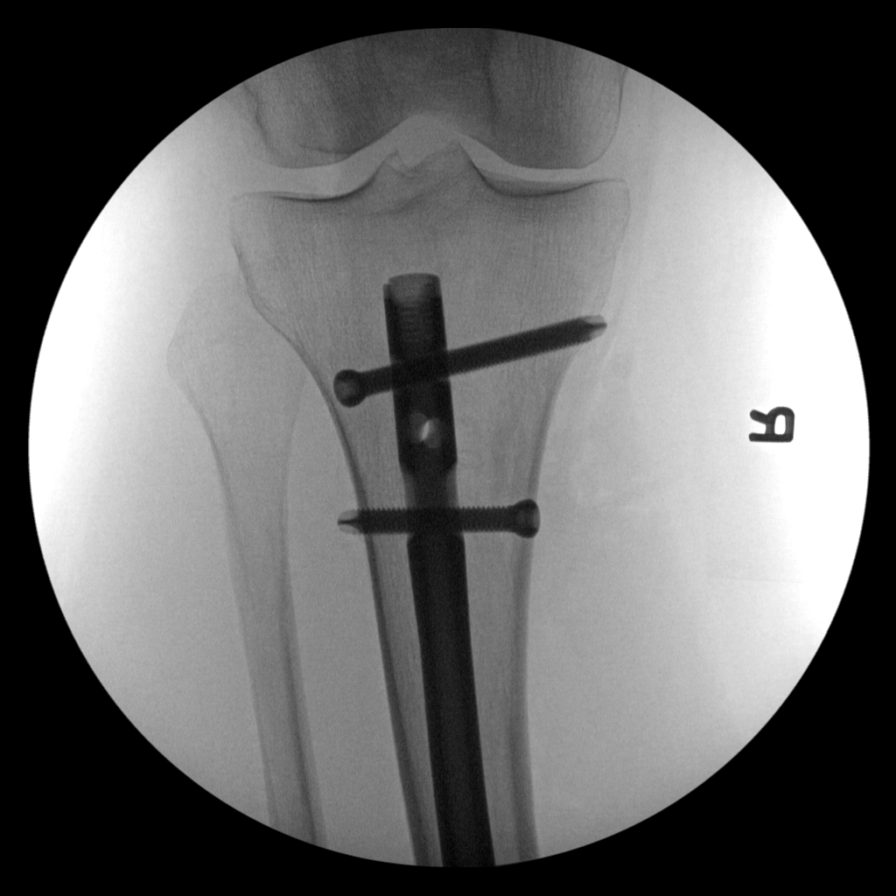
[im 6/7]
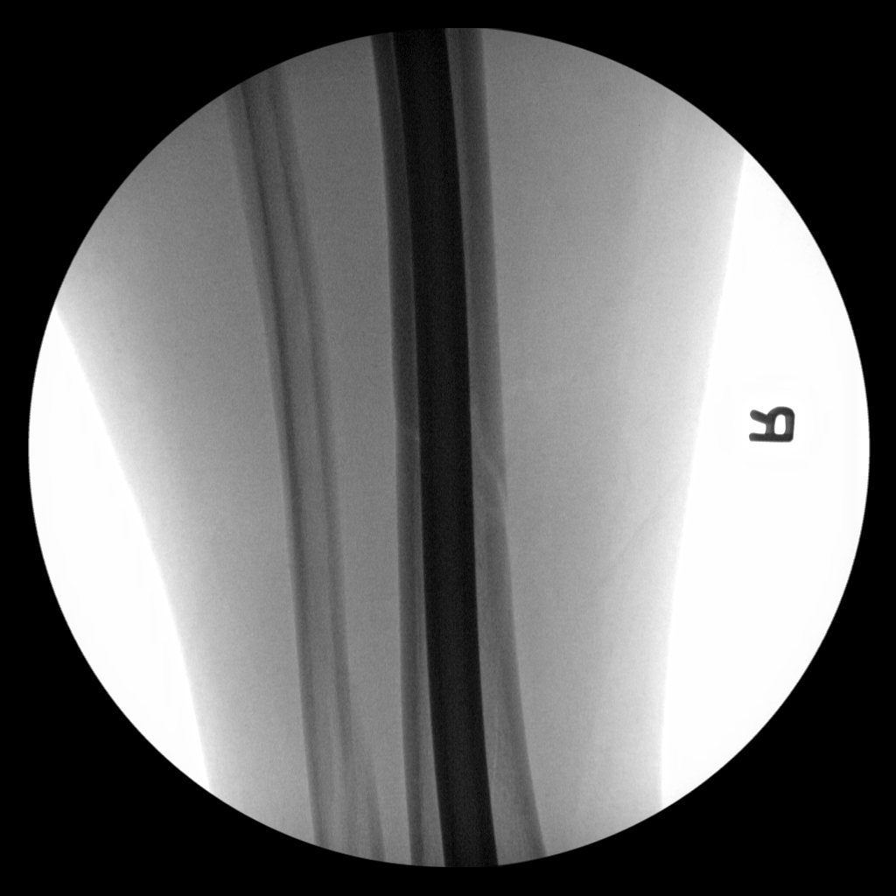
[im 7/7]
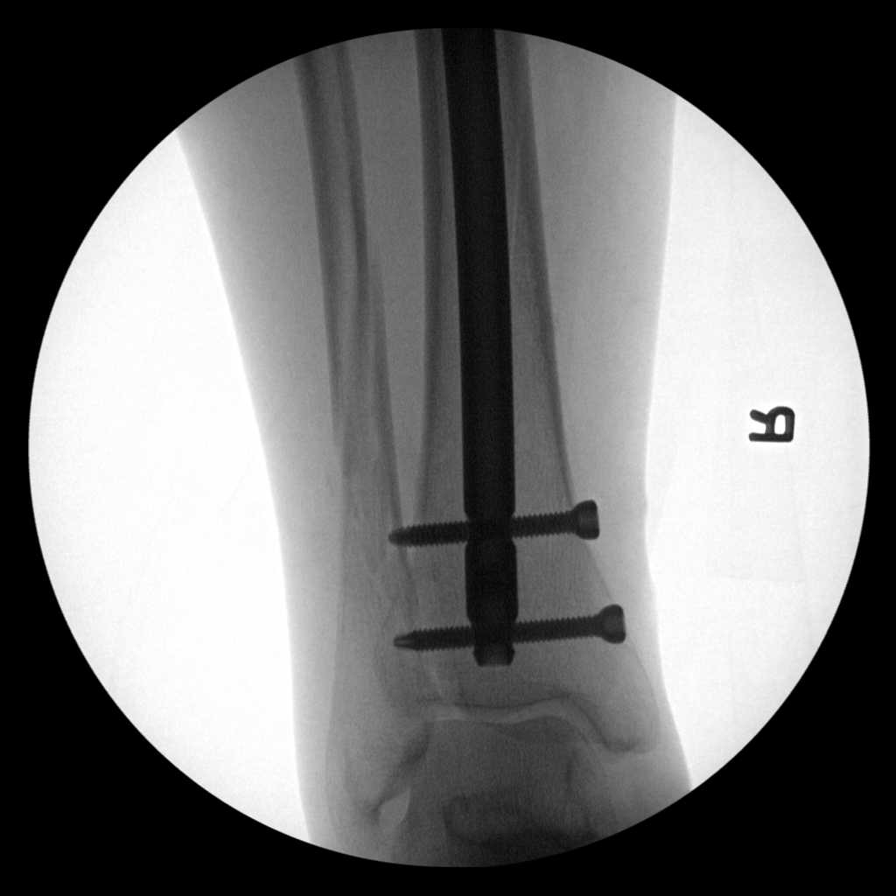

[7 of 7 positions shown; findings below may reference images not displayed]

FLUOROSCOPY TIME:  Fluoroscopy Time:  3 minutes 25 seconds

Radiation Exposure Index (if provided by the fluoroscopic device):
Not available

Number of Acquired Spot Images: 7
FINDINGS: Medullary rod is noted in the right tibia with both proximal and
distal fixation screws. Fracture fragments are in anatomic
alignment. The fibular fracture is in near anatomic alignment with
only mild displacement at the fracture site.
IMPRESSION: ORIF of right tibial fracture.

## 2018-04-25 IMAGING — DX DG TIBIA/FIBULA 2V*R*
5 series · 5 of 5 positions shown · non-contrast
Comparison: None.

CLINICAL DATA: Right leg deformity status post fall.

EXAM:
RIGHT TIBIA AND FIBULA - 2 VIEW

[tibia ap (1 of 3)]
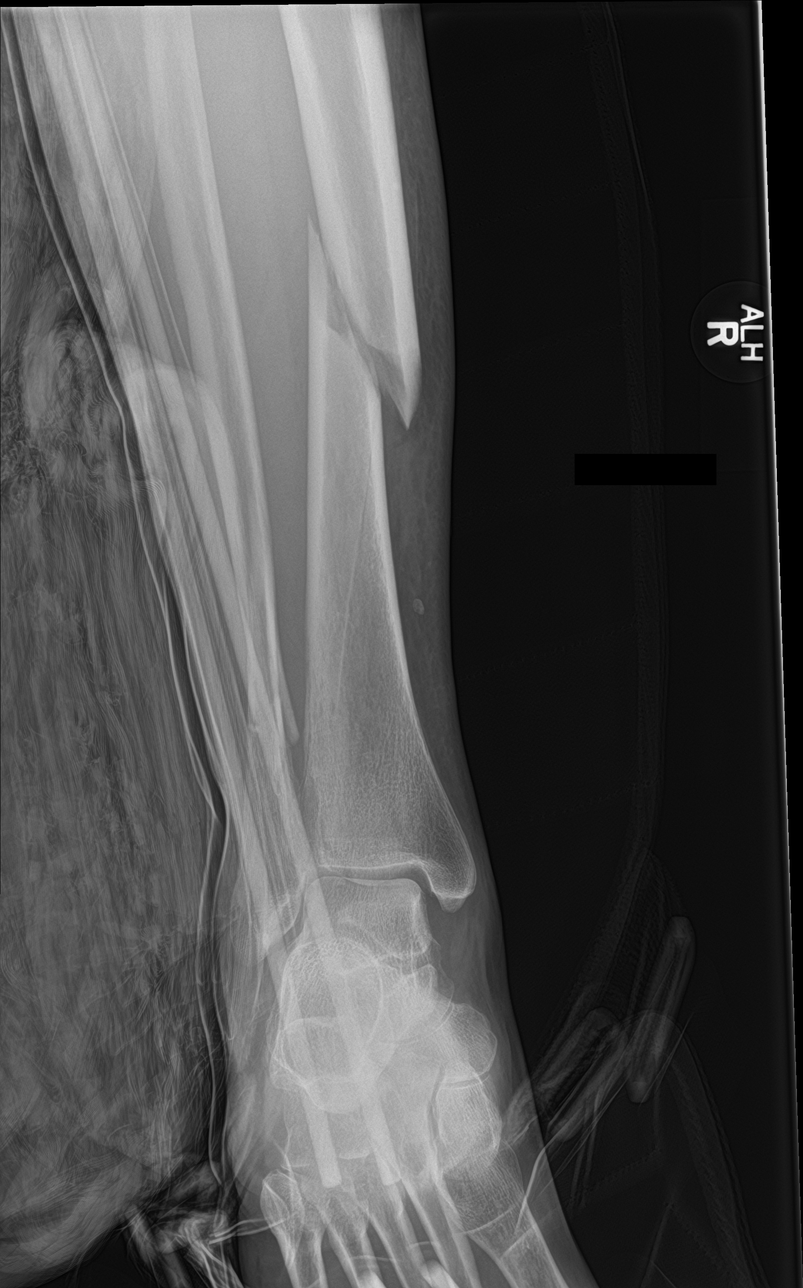

[tibia ap (2 of 3)]
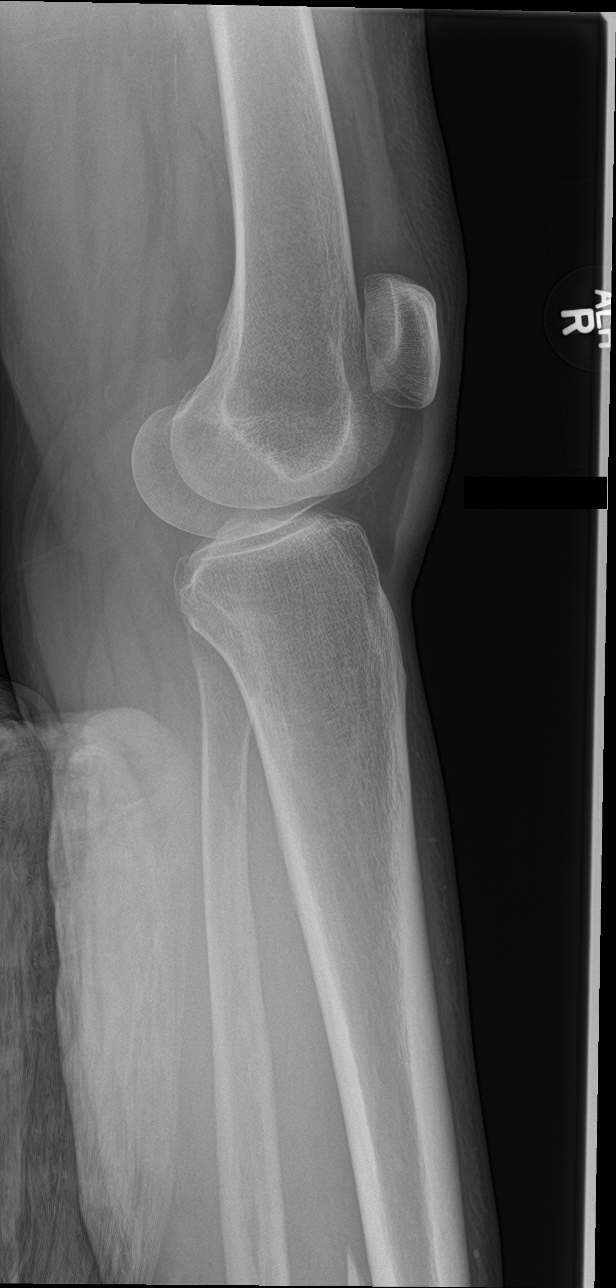

[tibia lat (1 of 2)]
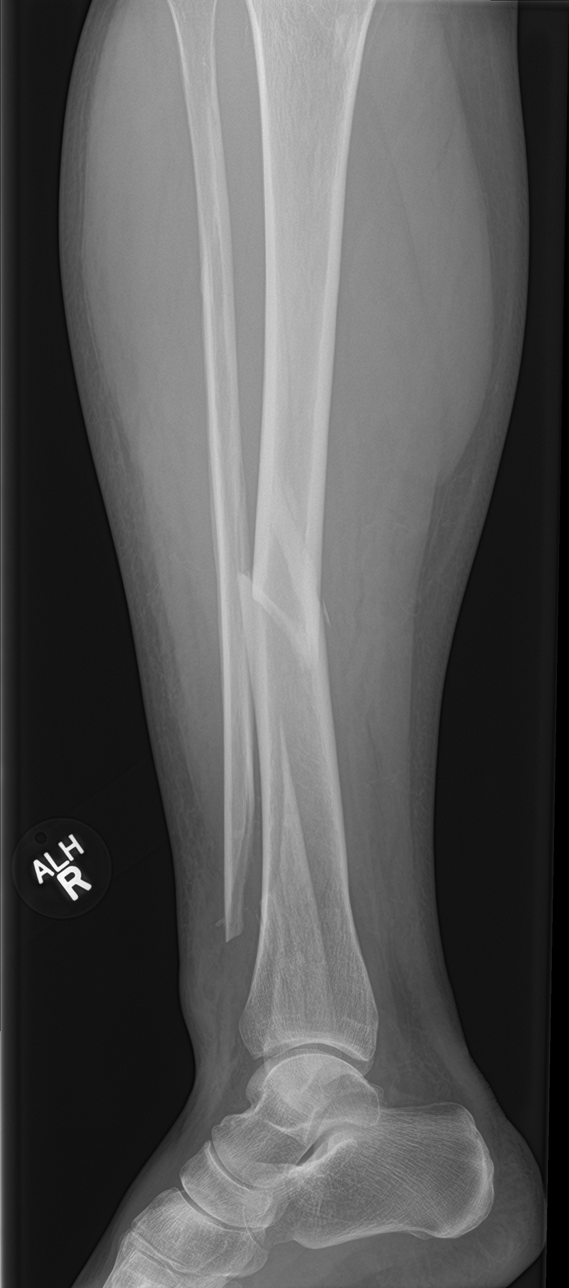

[tibia lat (2 of 2)]
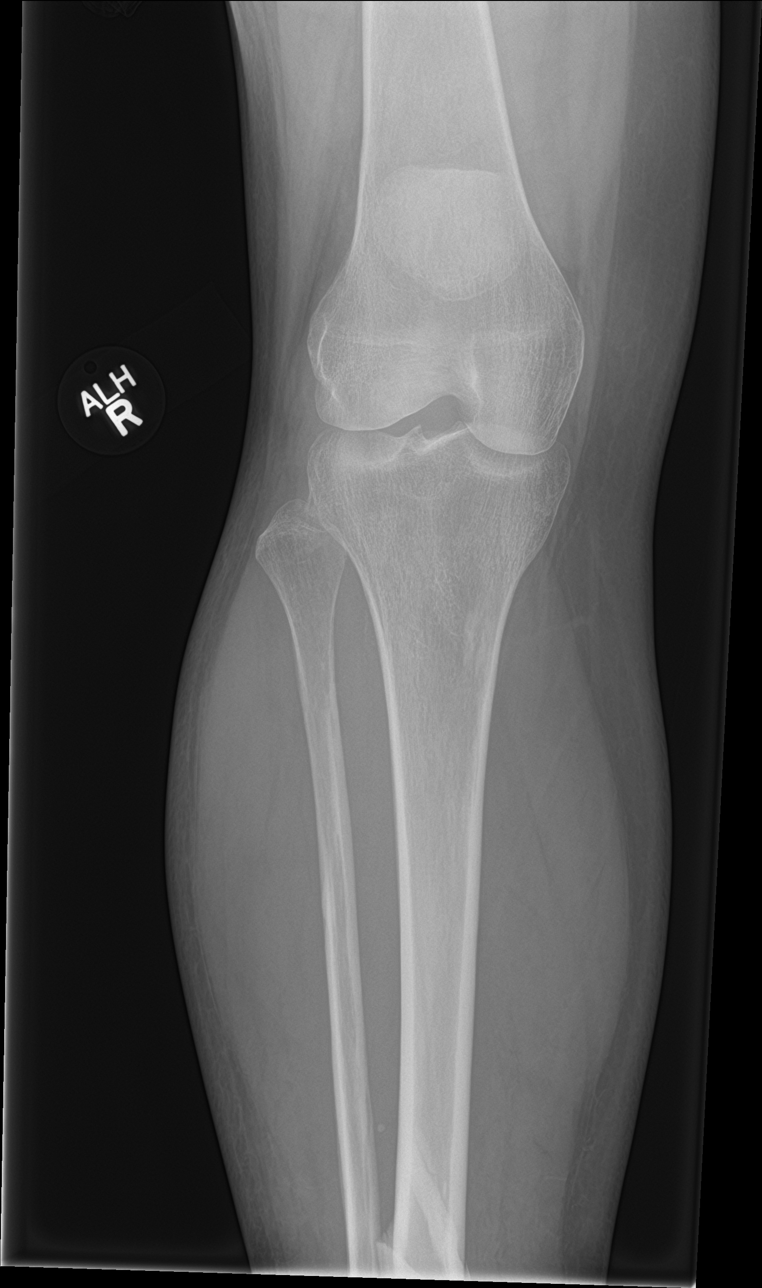

[tibia ap (3 of 3)]
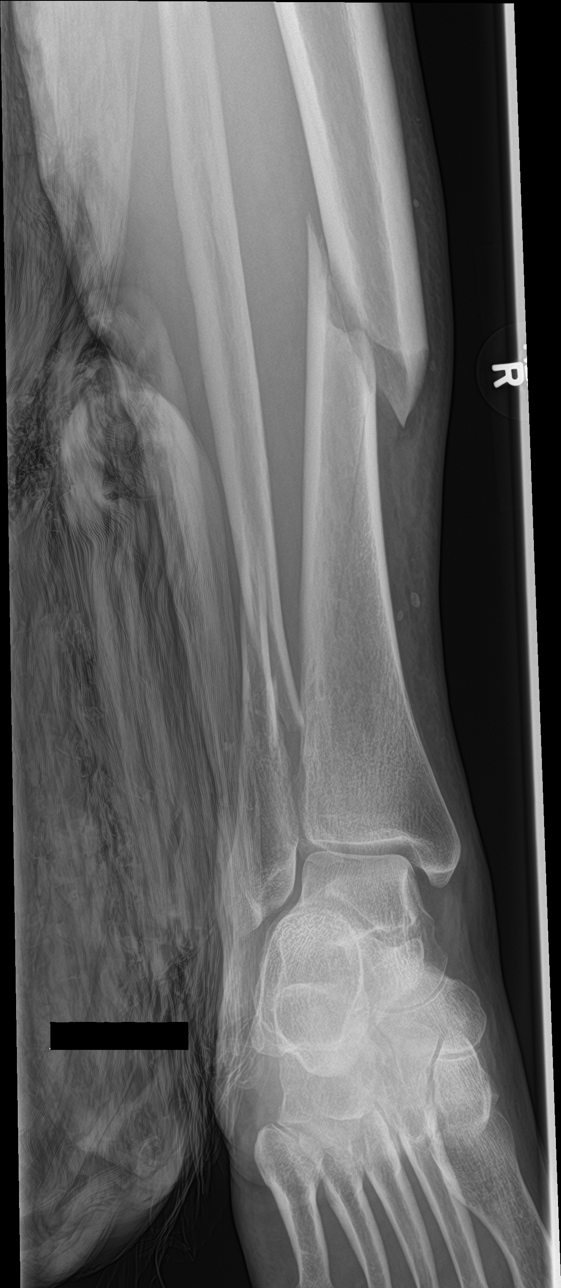

[5 of 5 positions shown; findings below may reference images not displayed]

FINDINGS: Oblique fracture of the mid -distal right tibial diaphysis with 10
mm of lateral displacement and 6 mm of anterior displacement. Mild
apex anterior angulation.

Oblique displaced fracture of the distal fibular diaphysis with a 10
mm of lateral displacement and 13 mm of posterior displacement.

No other fracture or dislocation.  Ankle mortise is intact.

Soft tissue swelling around the fracture site.
IMPRESSION: 1. Oblique fracture of the mid -distal right tibial diaphysis with
10 mm of lateral displacement and 6 mm of anterior displacement.
2. Oblique displaced fracture of the distal fibular diaphysis with a
10 mm of lateral displacement and 13 mm of posterior displacement.

## 2019-05-07 ENCOUNTER — Other Ambulatory Visit: Payer: Self-pay | Admitting: Physician Assistant

## 2019-05-07 DIAGNOSIS — Z1231 Encounter for screening mammogram for malignant neoplasm of breast: Secondary | ICD-10-CM

## 2019-05-29 ENCOUNTER — Ambulatory Visit
Admission: RE | Admit: 2019-05-29 | Discharge: 2019-05-29 | Disposition: A | Discharge status: Home / Self Care | Payer: 59 | Source: Ambulatory Visit | Attending: Physician Assistant | Admitting: Physician Assistant

## 2019-05-29 ENCOUNTER — Other Ambulatory Visit: Payer: Self-pay

## 2019-05-29 DIAGNOSIS — Z1231 Encounter for screening mammogram for malignant neoplasm of breast: Secondary | ICD-10-CM

## 2020-08-20 DIAGNOSIS — E7800 Pure hypercholesterolemia, unspecified: Secondary | ICD-10-CM | POA: Diagnosis not present

## 2020-08-20 DIAGNOSIS — K089 Disorder of teeth and supporting structures, unspecified: Secondary | ICD-10-CM | POA: Diagnosis not present

## 2020-08-20 DIAGNOSIS — I10 Essential (primary) hypertension: Secondary | ICD-10-CM | POA: Diagnosis not present

## 2020-08-20 DIAGNOSIS — E118 Type 2 diabetes mellitus with unspecified complications: Secondary | ICD-10-CM | POA: Diagnosis not present

## 2021-07-16 ENCOUNTER — Encounter: Payer: Self-pay | Admitting: *Deleted

## 2021-08-16 ENCOUNTER — Encounter: Payer: Self-pay | Admitting: Gastroenterology

## 2021-08-20 NOTE — Progress Notes (Signed)
 Urgent Care Department Provider Note  PHQ-2 Score: 0  PHQ-9 Score:    Van Score:    Screening complete, no depression identified / no further action needed today  HPI  SUBJECTIVE:  [name] [name] is a 56 y.o. female who presents with complaints of hyperglycemia, left otalgia, vertigo, fatigue, thirst, blurred vision, back pain, severe headache, and needing refills on her insulin .  Patient has been out of insulin  for 3 days and she takes units 500 Humulin R  30 to 50 units SQ 3 times daily as directed.  Patient currently is also on Januvia.  Patient reports urinary frequency and she feels bad because her sugars are high she has blurred vision and also she feels dry or dehydrated.  Patient reports vertiginous symptoms if she tries to look up or down or movement.  Patient has left ear pain and feels like she is got fluid in her left ear.  History   Past Medical History:  Diagnosis Date  . Diabetes mellitus (CMS-HCC)   . Hyperlipidemia   . Hypertension   . S/P discectomy for herniated nucleus pulposus     No past surgical history on file.  No family history on file.  Social History   Tobacco Use  . Smoking status: Never  . Smokeless tobacco: Never  Substance Use Topics  . Alcohol use: Never  . Drug use: Never     Current Outpatient Medications:  .  blood sugar diagnostic (GLUCOSE BLOOD) Strp, Use as instructed to check blood sugar 4 times per day dx code 250.02, Disp: , Rfl:  .  lisinopriL  (PRINIVIL ,ZESTRIL ) 40 MG tablet, Take 1 tablet (40 mg total) by mouth daily., Disp: , Rfl:  .  empagliflozin (JARDIANCE) 25 mg tablet, Take 1 tablet (25 mg total) by mouth daily., Disp: , Rfl:  .  insulin  NPH (HUMULIN,NOVOLIN) 100 unit/mL injection, Inject 5 mL (500 Units total) under the skin daily., Disp: , Rfl:  .  insulin  regular hum U-500 conc (HUMULIN R ) 500 unit/mL CONCENTRATED injection, Inject 30 Units under the skin Three (3) times a day before meals., Disp: 20 mL, Rfl: 3 .   meclizine (ANTIVERT) 25 mg tablet, Take 1 tablet (25 mg total) by mouth Three (3) times a day as needed., Disp: 30 tablet, Rfl: 0 .  neomycin-polymyxin-hydrocortisone (CORTISPORIN) otic solution, Administer 3 drops into the left ear four (4) times a day for 10 days., Disp: 10 mL, Rfl: 0 .  pediatric multivitamin Chew tablet, Chew 1 tablet daily., Disp: , Rfl:   Current Facility-Administered Medications:  .  insulin  regular (HumuLIN,NovoLIN) injection 10 Units, 10 Units, Subcutaneous, Once, Elsie Maple Maze, FNP .  ketorolac  (TORADOL ) injection 30 mg, 30 mg, Intravenous, Once, Elsie Maple Maze, FNP  ROS   As noted in HPI. All other ROS negative.    Physical Exam   BP 203/110   Pulse 87   Resp 20   Ht 170.2 cm (5' 7)   Wt 89.8 kg (198 lb)   SpO2 99%   BMI 31.01 kg/m   Constitutional:  Well developed, well nourished, no acute distress Eyes:   EOMI, conjunctiva normal bilaterally HENT:  Normocephalic, atraumatic, left EAC is tender with speculum exam left TM within normal limits right EAC and TM within normal limits oropharynx is dry Respiratory:  Normal inspiratory effort lungs CTA Cardiovascular:  Normal rate normal rhythm S1-S2 without murmur GI:  nondistended Integument: No rash, skin intact Musculoskeletal:  No edema, no deformities Neurologic:  Alert & oriented x 3,  no focal neuro deficits Psychiatric:  Speech and behavior appropriate    Urgent Care Course   Orders Placed This Encounter  Procedures  . Urine dipstick  . POCT urinalysis dipstick    Order Specific Question:   Release to patient    Answer:   Immediate    Results for orders placed or performed in visit on 08/20/21  POCT urinalysis dipstick  Result Value Ref Range   Color, UA Yellow    Clarity, UA Clear    Glucose, UA 500 mg/dL (A) Negative   Bilirubin, UA Negative Negative   Ketones, POC Negative Negative   Spec Grav, UA 1.020 1.005 - 1.030   Blood, UA Trace-intact (A) Negative   pH, UA  6.0 5.0 - 9.0   Protein, UA 30 mg/dL (A) Negative   Urobilinogen, UA 0.2 E.U./dL Negative (0.2 mg/dL)   Leukocytes, UA Negative Negative   Nitrite, UA Negative Negative   STRIP LOT NUMBER [id number]    STRIP LOT EXPIRATION 07/04/2022     Patient was never admitted. Urgent Care Clinical Impression   Final diagnoses:  Type 2 diabetes mellitus with diabetic nephropathy, with long-term current use of insulin  (CMS-HCC) (Primary)  Vertigo  Dehydration  Intractable tension-type headache, unspecified chronicity pattern  Acute otalgia, left    Urgent Care Assessment/Plan  Humulin R  10 units subcu here in the office for fingerstick blood sugar 450  IV normal saline 1 L given over an hour Toradol  30 mg IV given here in the office for headache and left ear pain  Refill given on units 500 Humulin R  insulin   Advised patient to get a primary care provider  Discussed medical decision-making, plan for follow-up with patient. Discussed signs and symptoms that should prompt return to the emergency department. Patient agrees with plan.  New Prescriptions   INSULIN  REGULAR HUM U-500 CONC (HUMULIN R ) 500 UNIT/ML CONCENTRATED INJECTION    Inject 30 Units under the skin Three (3) times a day before meals.   MECLIZINE (ANTIVERT) 25 MG TABLET    Take 1 tablet (25 mg total) by mouth Three (3) times a day as needed.   NEOMYCIN-POLYMYXIN-HYDROCORTISONE (CORTISPORIN) OTIC SOLUTION    Administer 3 drops into the left ear four (4) times a day for 10 days.

## 2022-04-12 ENCOUNTER — Other Ambulatory Visit: Payer: Self-pay | Admitting: Internal Medicine

## 2022-04-12 DIAGNOSIS — Z1231 Encounter for screening mammogram for malignant neoplasm of breast: Secondary | ICD-10-CM

## 2022-06-15 ENCOUNTER — Ambulatory Visit
Admission: RE | Admit: 2022-06-15 | Discharge: 2022-06-15 | Disposition: A | Discharge status: Home / Self Care | Payer: BC Managed Care – PPO | Source: Ambulatory Visit | Attending: Internal Medicine | Admitting: Internal Medicine

## 2022-06-15 DIAGNOSIS — Z1231 Encounter for screening mammogram for malignant neoplasm of breast: Secondary | ICD-10-CM | POA: Diagnosis not present

## 2023-02-04 DIAGNOSIS — Z0000 Encounter for general adult medical examination without abnormal findings: Secondary | ICD-10-CM | POA: Diagnosis not present

## 2023-02-04 DIAGNOSIS — Z13220 Encounter for screening for lipoid disorders: Secondary | ICD-10-CM | POA: Diagnosis not present

## 2023-02-04 DIAGNOSIS — E1065 Type 1 diabetes mellitus with hyperglycemia: Secondary | ICD-10-CM | POA: Diagnosis not present

## 2023-09-03 ENCOUNTER — Other Ambulatory Visit: Payer: Self-pay

## 2023-09-03 ENCOUNTER — Encounter (HOSPITAL_BASED_OUTPATIENT_CLINIC_OR_DEPARTMENT_OTHER): Payer: Self-pay | Admitting: Emergency Medicine

## 2023-09-03 ENCOUNTER — Emergency Department (HOSPITAL_BASED_OUTPATIENT_CLINIC_OR_DEPARTMENT_OTHER): Payer: Self-pay | Admitting: Radiology

## 2023-09-03 ENCOUNTER — Emergency Department (HOSPITAL_BASED_OUTPATIENT_CLINIC_OR_DEPARTMENT_OTHER)
Admission: EM | Admit: 2023-09-03 | Discharge: 2023-09-03 | Disposition: A | Discharge status: Home / Self Care | Payer: Self-pay

## 2023-09-03 DIAGNOSIS — L97429 Non-pressure chronic ulcer of left heel and midfoot with unspecified severity: Secondary | ICD-10-CM | POA: Insufficient documentation

## 2023-09-03 DIAGNOSIS — Z7984 Long term (current) use of oral hypoglycemic drugs: Secondary | ICD-10-CM | POA: Insufficient documentation

## 2023-09-03 DIAGNOSIS — R739 Hyperglycemia, unspecified: Secondary | ICD-10-CM

## 2023-09-03 DIAGNOSIS — S2232XA Fracture of one rib, left side, initial encounter for closed fracture: Secondary | ICD-10-CM | POA: Insufficient documentation

## 2023-09-03 DIAGNOSIS — E11621 Type 2 diabetes mellitus with foot ulcer: Secondary | ICD-10-CM | POA: Insufficient documentation

## 2023-09-03 DIAGNOSIS — Z794 Long term (current) use of insulin: Secondary | ICD-10-CM | POA: Insufficient documentation

## 2023-09-03 DIAGNOSIS — R5383 Other fatigue: Secondary | ICD-10-CM | POA: Insufficient documentation

## 2023-09-03 DIAGNOSIS — E1165 Type 2 diabetes mellitus with hyperglycemia: Secondary | ICD-10-CM | POA: Insufficient documentation

## 2023-09-03 DIAGNOSIS — W19XXXA Unspecified fall, initial encounter: Secondary | ICD-10-CM | POA: Insufficient documentation

## 2023-09-03 DIAGNOSIS — M549 Dorsalgia, unspecified: Secondary | ICD-10-CM | POA: Insufficient documentation

## 2023-09-03 LAB — URINALYSIS, ROUTINE W REFLEX MICROSCOPIC
Bacteria, UA: NONE SEEN
Bilirubin Urine: NEGATIVE
Glucose, UA: >1000 mg/dL — AB
Leukocytes,Ua: NEGATIVE
Nitrite: NEGATIVE
Protein, ur: 100 mg/dL — AB
Specific Gravity, Urine: 1.029 (ref 1.005–1.030)
pH: 6 (ref 5.0–8.0)

## 2023-09-03 LAB — I-STAT VENOUS BLOOD GAS, ED
Acid-Base Excess: 1 mmol/L (ref 0.0–2.0)
Bicarbonate: 23.8 mmol/L (ref 20.0–28.0)
Calcium, Ion: 1.13 mmol/L — ABNORMAL LOW (ref 1.15–1.40)
HCT: 34 % — ABNORMAL LOW (ref 36.0–46.0)
Hemoglobin: 11.6 g/dL — ABNORMAL LOW (ref 12.0–15.0)
O2 Saturation: 88 %
Potassium: 4 mmol/L (ref 3.5–5.1)
Sodium: 131 mmol/L — ABNORMAL LOW (ref 135–145)
TCO2: 25 mmol/L (ref 22–32)
pCO2, Ven: 31.3 mmHg — ABNORMAL LOW (ref 44–60)
pH, Ven: 7.489 — ABNORMAL HIGH (ref 7.25–7.43)
pO2, Ven: 49 mmHg — ABNORMAL HIGH (ref 32–45)

## 2023-09-03 LAB — CBC
HCT: 35.3 % — ABNORMAL LOW (ref 36.0–46.0)
Hemoglobin: 12.1 g/dL (ref 12.0–15.0)
MCH: 26 pg (ref 26.0–34.0)
MCHC: 34.3 g/dL (ref 30.0–36.0)
MCV: 75.9 fL — ABNORMAL LOW (ref 80.0–100.0)
Platelets: 270 K/uL (ref 150–400)
RBC: 4.65 MIL/uL (ref 3.87–5.11)
RDW: 13.2 % (ref 11.5–15.5)
WBC: 6.3 K/uL (ref 4.0–10.5)
nRBC: 0 % (ref 0.0–0.2)

## 2023-09-03 LAB — BASIC METABOLIC PANEL WITH GFR
Anion gap: 14 (ref 5–15)
BUN: 28 mg/dL — ABNORMAL HIGH (ref 6–20)
CO2: 21 mmol/L — ABNORMAL LOW (ref 22–32)
Calcium: 9.5 mg/dL (ref 8.9–10.3)
Chloride: 96 mmol/L — ABNORMAL LOW (ref 98–111)
Creatinine, Ser: 1.09 mg/dL — ABNORMAL HIGH (ref 0.44–1.00)
GFR, Estimated: 59 mL/min — ABNORMAL LOW (ref >60)
Glucose, Bld: 471 mg/dL — ABNORMAL HIGH (ref 70–99)
Potassium: 4.3 mmol/L (ref 3.5–5.1)
Sodium: 131 mmol/L — ABNORMAL LOW (ref 135–145)

## 2023-09-03 LAB — CBG MONITORING, ED
Glucose-Capillary: 449 mg/dL — ABNORMAL HIGH (ref 70–99)
Glucose-Capillary: 342 mg/dL — ABNORMAL HIGH (ref 70–99)

## 2023-09-03 LAB — LACTIC ACID, PLASMA: Lactic Acid, Venous: 1.4 mmol/L (ref 0.5–1.9)

## 2023-09-03 MED ORDER — INSULIN REGULAR(HUMAN) IN NACL 100-0.9 UT/100ML-% IV SOLN: INTRAVENOUS | Status: DC

## 2023-09-03 MED ORDER — LACTATED RINGERS IV BOLUS
1000.0000 mL | Freq: Once | INTRAVENOUS | Status: AC
  Administered 2023-09-03: 1000 mL via INTRAVENOUS

## 2023-09-03 MED ORDER — DEXTROSE 50 % IV SOLN: 0.0000 mL | INTRAVENOUS | Status: DC | PRN

## 2023-09-03 MED ORDER — KETOROLAC TROMETHAMINE 15 MG/ML IJ SOLN
15.0000 mg | Freq: Once | INTRAMUSCULAR | Status: AC
  Administered 2023-09-03: 15 mg via INTRAVENOUS
  Filled 2023-09-03: qty 1

## 2023-09-03 MED ORDER — DEXTROSE IN LACTATED RINGERS 5 % IV SOLN: INTRAVENOUS | Status: DC

## 2023-09-03 MED ORDER — METHOCARBAMOL 500 MG PO TABS: 500.0000 mg | ORAL_TABLET | Freq: Three times a day (TID) | ORAL | 0 refills | Status: AC | PRN

## 2023-09-03 MED ORDER — LACTATED RINGERS IV SOLN: INTRAVENOUS | Status: DC

## 2023-09-03 MED ORDER — INSULIN ASPART 100 UNIT/ML IJ SOLN
11.0000 [IU] | Freq: Once | INTRAMUSCULAR | Status: AC
  Administered 2023-09-03: 11 [IU] via INTRAVENOUS

## 2023-09-03 NOTE — ED Notes (Signed)
 I-Stat performed by RRT

## 2023-09-03 NOTE — ED Notes (Signed)
 DC paperwork given and verbally understood.

## 2023-09-03 NOTE — ED Notes (Signed)
 Incentive spirometer given by RRT.

## 2023-09-03 NOTE — Discharge Instructions (Addendum)
 For your hyperglycemia is important that you continue to monitor your blood sugar and take your insulin  as prescribed.  Call a primary care doctor's office Monday to make a close follow-up appointment.  Drink lots of water.  You were offered admission for your hypoglycemia but declined.  If you change your mind or start to feel worse return to the ER  Your x-ray shows you had 1 rib fracture of your eighth rib in the back.  Rib fractures will heal on their own.  You can use Tylenol  as needed for pain or Robaxin  up to 3 times a day as needed for pain.  Return to the ER for any new or worsening symptoms.

## 2023-09-03 NOTE — ED Triage Notes (Signed)
 Pt via pov from home with multiple complaints. Pt is diabetic and has had HIGH sugar readings the last 2 nights.Pt has been compliant on her insulin  injections. She also has a diabetic ulcer on her left big toe. Pt took a fall on 08/25/2023 and has had pain in her back left that seems to be getting worse. Pain is in mid left side of her back. Pt endorses some burning and bubbles with urination. Pt a&o x 4; nad noted.

## 2023-09-03 NOTE — ED Provider Notes (Signed)
 [place] EMERGENCY DEPARTMENT AT [facility] Provider Note   CSN: [id number] Arrival date & time: 09/03/23  1256     Patient presents with: Hyperglycemia and Back Pain   [name] [name] is a 58 y.o. female.   58 year old female presents for evaluation of multiple complaints.  States she had a fall today when she is having left-sided rib pain since then.  States this morning she also had some chest pain.  Also states her glucose has been high and she has a diabetic ulcer on her foot.  She states it seems to be worse than usual and she has had subjective fevers and chills and fatigue at home this week.  States she has been compliant with her medications.  Denies any other symptoms or concerns.   Hyperglycemia Associated symptoms: fatigue and fever   Associated symptoms: no abdominal pain, no chest pain, no dysuria, no shortness of breath and no vomiting   Back Pain Associated symptoms: fever   Associated symptoms: no abdominal pain, no chest pain and no dysuria        Prior to Admission medications   Medication Sig Start Date End Date Taking? Authorizing Provider  methocarbamol  (ROBAXIN ) 500 MG tablet Take 1 tablet (500 mg total) by mouth 3 (three) times daily as needed for up to 7 days for muscle spasms (pain). 09/03/23 09/10/23 Yes Yeily Link L, DO  atorvastatin  (LIPITOR) 20 MG tablet Take 20 mg by mouth daily. 04/08/16   [provider]  docusate sodium  (COLACE) 100 MG capsule Take 1 capsule (100 mg total) by mouth 2 (two) times daily. 06/25/16   Rama, Tawni SQUIBB, MD  empagliflozin (JARDIANCE) 25 MG TABS tablet Take 25 mg by mouth daily.    [provider]  enoxaparin  (LOVENOX ) 40 MG/0.4ML injection Inject 0.4 mLs (40 mg total) into the skin daily. 06/26/16   Swinteck, Redell, MD  glucose blood (ONETOUCH VERIO) test strip Use as instructed to check blood sugar 4 times per day dx code 250.02 Patient not taking: Reported on 06/24/2016 05/28/13   Von [place], MD   HYDROcodone -acetaminophen  (NORCO/VICODIN) 5-325 MG tablet Take 1-2 tablets by mouth every 4 (four) hours as needed (breakthrough pain). 06/25/16   Swinteck, Redell, MD  insulin  regular human CONCENTRATED (HUMULIN R ) 500 UNIT/ML SOLN injection Inject 20 Units into the skin 2 (two) times daily. Breakfast and lunch    [provider]  lisinopril  (PRINIVIL ,ZESTRIL ) 40 MG tablet Take 1 tablet (40 mg total) by mouth daily. 11/06/13   Von [place], MD  Multiple Vitamins-Minerals (MULTIVITAMIN PO) Take 1 tablet by mouth daily.    [provider]  ondansetron  (ZOFRAN ) 4 MG tablet Take 1 tablet (4 mg total) by mouth every 6 (six) hours as needed for nausea. 06/25/16   Rama, Tawni SQUIBB, MD  pravastatin  (PRAVACHOL ) 40 MG tablet Take 1 tablet (40 mg total) by mouth daily. Patient not taking: Reported on 06/24/2016 09/28/13   Von [place], MD    Allergies: Patient has no known allergies.    Review of Systems  Constitutional:  Positive for chills, fatigue and fever.  HENT:  Negative for ear pain and sore throat.   Eyes:  Negative for pain and visual disturbance.  Respiratory:  Negative for cough and shortness of breath.   Cardiovascular:  Negative for chest pain and palpitations.  Gastrointestinal:  Negative for abdominal pain and vomiting.  Genitourinary:  Negative for dysuria and hematuria.  Musculoskeletal:  Positive for back pain. Negative for arthralgias.  Skin:  Negative for color change and rash.  Neurological:  Negative for seizures and syncope.  All other systems reviewed and are negative.   Updated Vital Signs BP (!) 178/96   Pulse 94   Temp 98.7 F (37.1 C) (Oral)   Resp 14   Ht 5' 7 (1.702 m)   Wt 101.5 kg   SpO2 96%   BMI 35.05 kg/m   Physical Exam Vitals and nursing note reviewed.  Constitutional:      General: She is not in acute distress.    Appearance: Normal appearance. She is well-developed. She is not ill-appearing.  HENT:     Head: Normocephalic and  atraumatic.  Eyes:     Conjunctiva/sclera: Conjunctivae normal.  Cardiovascular:     Rate and Rhythm: Normal rate and regular rhythm.     Heart sounds: No murmur heard. Pulmonary:     Effort: Pulmonary effort is normal. No respiratory distress.     Breath sounds: Normal breath sounds.  Abdominal:     Palpations: Abdomen is soft.     Tenderness: There is no abdominal tenderness.  Musculoskeletal:        General: No swelling.     Cervical back: Neck supple.     Comments: There is tenderness to palpation over the left posterior ribs and there is a diabetic ulcer on the plantar surface of the great toe with some surrounding erythema but no drainage  Skin:    General: Skin is warm and dry.     Capillary Refill: Capillary refill takes less than 2 seconds.  Neurological:     Mental Status: She is alert.  Psychiatric:        Mood and Affect: Mood normal.     (all labs ordered are listed, but only abnormal results are displayed) Labs Reviewed  BASIC METABOLIC PANEL WITH GFR - Abnormal; Notable for the following components:      Result Value   Sodium 131 (*)    Chloride 96 (*)    CO2 21 (*)    Glucose, Bld 471 (*)    BUN 28 (*)    Creatinine, Ser 1.09 (*)    GFR, Estimated 59 (*)    All other components within normal limits  CBC - Abnormal; Notable for the following components:   HCT 35.3 (*)    MCV 75.9 (*)    All other components within normal limits  URINALYSIS, ROUTINE W REFLEX MICROSCOPIC - Abnormal; Notable for the following components:   Glucose, UA >1,000 (*)    Hgb urine dipstick SMALL (*)    Ketones, ur TRACE (*)    Protein, ur 100 (*)    All other components within normal limits  CBG MONITORING, ED - Abnormal; Notable for the following components:   Glucose-Capillary 449 (*)    All other components within normal limits  I-STAT VENOUS BLOOD GAS, ED - Abnormal; Notable for the following components:   pH, Ven 7.489 (*)    pCO2, Ven 31.3 (*)    pO2, Ven 49 (*)     Sodium 131 (*)    Calcium , Ion 1.13 (*)    HCT 34.0 (*)    Hemoglobin 11.6 (*)    All other components within normal limits  CBG MONITORING, ED - Abnormal; Notable for the following components:   Glucose-Capillary 342 (*)    All other components within normal limits  CULTURE, BLOOD (ROUTINE X 2)  CULTURE, BLOOD (ROUTINE X 2)  LACTIC ACID, PLASMA  LACTIC ACID, PLASMA  TROPONIN T, HIGH SENSITIVITY    EKG: EKG Interpretation Date/Time:  Saturday September 03 2023 13:51:38 EDT Ventricular Rate:  102 PR Interval:  166 QRS Duration:  85 QT Interval:  361 QTC Calculation: 471 R Axis:   -40  Text Interpretation: Sinus tachycardia Left axis deviation No STEMI No acute change when compared to prior EKG from 06/23/2016 Confirmed by Gennaro Bouchard ([id number]) on 09/03/2023 2:03:25 PM  Radiology: [facility] Foot 2 Views Left Result Date: 09/03/2023 CLINICAL DATA:  left great toe wound, diabetic EXAM: LEFT FOOT - 2 VIEW COMPARISON:  None Available. FINDINGS: No acute fracture or dislocation. Joint spaces and alignment are maintained. No area of erosion or osseous destruction. No unexpected radiopaque foreign body. Soft tissue irregularity along the plantar aspect of the great toe. Vascular calcifications. IMPRESSION: No radiographic evidence of osteomyelitis. Electronically Signed   By: Corean Salter M.D.   On: 09/03/2023 14:38   DG Ribs Unilateral W/Chest Left Result Date: 09/03/2023 CLINICAL DATA:  Fall, left rib pain EXAM: LEFT RIBS AND CHEST - 3+ VIEW COMPARISON:  March 25, 2008 FINDINGS: Evaluation is limited by underpenetration. The cardiomediastinal silhouette is unchanged in contour. No pleural effusion. No pneumothorax. No acute pleuroparenchymal abnormality. There is a displaced rib fracture of the LEFT anterolateral approximate 8 rib. There is a hypoplastic LEFT first rib versus cervical rib. IMPRESSION: Displaced rib fracture of the LEFT anterolateral approximate 8th rib. No pneumothorax.  Electronically Signed   By: Corean Salter M.D.   On: 09/03/2023 14:37     Procedures   Medications Ordered in the ED  lactated ringers  bolus 1,000 mL (0 mLs Intravenous Stopped 09/03/23 1523)  insulin  aspart (novoLOG ) injection 11 Units (11 Units Intravenous Given 09/03/23 1451)  ketorolac  (TORADOL ) 15 MG/ML injection 15 mg (15 mg Intravenous Given 09/03/23 1452)                                    Medical Decision Making Medical Decision Making Nursing notes are reviewed. Differential diagnosis for this patient would include but not limited to: DKA, HHS, dehydration, fracture, sprain, contusion, other  Cardiac monitor interpretation: Sinus rhythm to sinus tachycardia, no ectopy  Emergency Department Course:  Vital signs and pulse oximetry are reviewed, evaluated by myself and found to be within normal limits prior to final disposition. Findings of laboratory testing and medical imaging are discussed with patient and family that is available. Patient agrees with the medical care plan as follows:  Patient's lab workup reviewed by me and she is hyperglycemic.  Has an anion gap of 14 but PA bicarb are within normal limits on her ABG.  She was offered admission for her hyperglycemia but declined like to go home.  Glucose improved with IV insulin  and fluids.  She also was found to have a rib fracture.  I will give her Robaxin  as a prescription to use as needed and she can use Tylenol  as needed.  Was given 1 dose of Toradol  here.  Advised to keep a close eye on her blood sugar and obtain close follow-up with primary care..  Given strict return precautions.  She feels comfortable being discharged.  Problems Addressed: Closed fracture of one rib of left side, initial encounter: acute illness or injury Diabetic ulcer of toe of left foot associated with type 2 diabetes mellitus, unspecified ulcer stage (HCC): chronic illness or injury Fall, initial encounter: acute illness or  injury Hyperglycemia: acute  illness or injury  Amount and/or Complexity of Data Reviewed External Data Reviewed: notes.    Details: Outpatient records reviewed and patient was last seen in primary care doctor's office about 7 months ago Labs: ordered. Decision-making details documented in ED Course.    Details: Ordered and reviewed by me and patient with slight anion gap but otherwise electrolytes are fairly unremarkable.  pH is unremarkable on her ABG.  Glucose improved after treatment Radiology: ordered and independent interpretation performed. Decision-making details documented in ED Course.    Details: X-rays were ordered and interpreted independently radiology Chest and rib x-ray shows evidence of left-sided eighth rib fracture Left foot x-ray shows no evidence of osteomyelitis ECG/medicine tests: ordered and independent interpretation performed. Decision-making details documented in ED Course.    Details: Ordered and interpreted by me in the absence of cardiology and shows sinus rhythm, no STEMI  Risk OTC drugs. Prescription drug management. Drug therapy requiring intensive monitoring for toxicity. Decision regarding hospitalization. Diagnosis or treatment significantly limited by social determinants of health.     Final diagnoses:  Hyperglycemia  Diabetic ulcer of toe of left foot associated with type 2 diabetes mellitus, unspecified ulcer stage (HCC)  Closed fracture of one rib of left side, initial encounter  Fall, initial encounter    ED Discharge Orders          Ordered    methocarbamol  (ROBAXIN ) 500 MG tablet  3 times daily PRN        09/03/23 1528               Ishitha Roper, Duwaine L, DO 09/03/23 1531

## 2023-09-08 LAB — CULTURE, BLOOD (ROUTINE X 2)
Culture: NO GROWTH
Culture: NO GROWTH
Special Requests: ADEQUATE
Special Requests: ADEQUATE
